# Patient Record
Sex: Female | Born: 1990 | Race: Black or African American | Hispanic: No | Marital: Single | Smoking: Never smoker
Health system: Southern US, Community
[De-identification: ages and names within clinical notes are randomized; demographics above are authoritative.]

## PROBLEM LIST (undated history)

## (undated) DIAGNOSIS — N946 Dysmenorrhea, unspecified: Secondary | ICD-10-CM

## (undated) DIAGNOSIS — B009 Herpesviral infection, unspecified: Secondary | ICD-10-CM

## (undated) DIAGNOSIS — D649 Anemia, unspecified: Secondary | ICD-10-CM

## (undated) DIAGNOSIS — R011 Cardiac murmur, unspecified: Secondary | ICD-10-CM

## (undated) DIAGNOSIS — O2230 Deep phlebothrombosis in pregnancy, unspecified trimester: Secondary | ICD-10-CM

## (undated) HISTORY — PX: NO PAST SURGERIES: SHX2092

## (undated) HISTORY — DX: Cardiac murmur, unspecified: R01.1

## (undated) HISTORY — DX: Dysmenorrhea, unspecified: N94.6

## (undated) HISTORY — DX: Anemia, unspecified: D64.9

## (undated) HISTORY — DX: Herpesviral infection, unspecified: B00.9

## (undated) HISTORY — PX: CYST REMOVAL HAND: SHX6279

## (undated) HISTORY — DX: Deep phlebothrombosis in pregnancy, unspecified trimester: O22.30

---

## 2013-12-31 ENCOUNTER — Ambulatory Visit (INDEPENDENT_AMBULATORY_CARE_PROVIDER_SITE_OTHER): Payer: PRIVATE HEALTH INSURANCE | Admitting: Certified Nurse Midwife

## 2013-12-31 ENCOUNTER — Encounter: Payer: Self-pay | Admitting: Certified Nurse Midwife

## 2013-12-31 VITALS — BP 100/60 | HR 64 | Resp 16 | Ht 64.0 in | Wt 103.0 lb

## 2013-12-31 DIAGNOSIS — Z862 Personal history of diseases of the blood and blood-forming organs and certain disorders involving the immune mechanism: Secondary | ICD-10-CM

## 2013-12-31 DIAGNOSIS — N946 Dysmenorrhea, unspecified: Secondary | ICD-10-CM

## 2013-12-31 DIAGNOSIS — Z01419 Encounter for gynecological examination (general) (routine) without abnormal findings: Secondary | ICD-10-CM

## 2013-12-31 DIAGNOSIS — D509 Iron deficiency anemia, unspecified: Secondary | ICD-10-CM

## 2013-12-31 DIAGNOSIS — Z309 Encounter for contraceptive management, unspecified: Secondary | ICD-10-CM

## 2013-12-31 DIAGNOSIS — Z0000 Encounter for general adult medical examination without abnormal findings: Secondary | ICD-10-CM

## 2013-12-31 LAB — POCT URINALYSIS DIPSTICK
Bilirubin, UA: NEGATIVE
Blood, UA: NEGATIVE
Glucose, UA: NEGATIVE
Ketones, UA: NEGATIVE
Leukocytes, UA: NEGATIVE
NITRITE UA: NEGATIVE
Protein, UA: NEGATIVE
UROBILINOGEN UA: NEGATIVE
pH, UA: 5

## 2013-12-31 LAB — CBC
HCT: 37.2 % (ref 36.0–46.0)
HEMOGLOBIN: 10.9 g/dL — AB (ref 12.0–15.0)
MCH: 20.5 pg — ABNORMAL LOW (ref 26.0–34.0)
MCHC: 29.3 g/dL — ABNORMAL LOW (ref 30.0–36.0)
MCV: 70.1 fL — ABNORMAL LOW (ref 78.0–100.0)
Platelets: 270 10*3/uL (ref 150–400)
RBC: 5.31 MIL/uL — AB (ref 3.87–5.11)
RDW: 14.9 % (ref 11.5–15.5)
WBC: 4.8 10*3/uL (ref 4.0–10.5)

## 2013-12-31 LAB — HEMOGLOBIN, FINGERSTICK: Hemoglobin, fingerstick: 11.1 g/dL — ABNORMAL LOW (ref 12.0–16.0)

## 2013-12-31 LAB — IBC PANEL
%SAT: 7 % — AB (ref 20–55)
TIBC: 474 ug/dL — ABNORMAL HIGH (ref 250–470)
UIBC: 442 ug/dL — ABNORMAL HIGH (ref 125–400)

## 2013-12-31 LAB — IRON: Iron: 32 ug/dL — ABNORMAL LOW (ref 42–145)

## 2013-12-31 MED ORDER — NORETHIN ACE-ETH ESTRAD-FE 1-20 MG-MCG(24) PO TABS: 1.0000 | ORAL_TABLET | Freq: Every day | ORAL | Status: DC

## 2013-12-31 NOTE — Patient Instructions (Signed)
General topics  Next pap or exam is  due in 1 year Take a Women's multivitamin Take 1200 mg. of calcium daily - prefer dietary If any concerns in interim to call back  Breast Self-Awareness Practicing breast self-awareness may pick up problems early, prevent significant medical complications, and possibly save your life. By practicing breast self-awareness, you can become familiar with how your breasts look and feel and if your breasts are changing. This allows you to notice changes early. It can also offer you some reassurance that your breast health is good. One way to learn what is normal for your breasts and whether your breasts are changing is to do a breast self-exam. If you find a lump or something that was not present in the past, it is best to contact your caregiver right away. Other findings that should be evaluated by your caregiver include nipple discharge, especially if it is bloody; skin changes or reddening; areas where the skin seems to be pulled in (retracted); or new lumps and bumps. Breast pain is seldom associated with cancer (malignancy), but should also be evaluated by a caregiver. BREAST SELF-EXAM The best time to examine your breasts is 5 7 days after your menstrual period is over.  ExitCare Patient Information 2013 [employer].   Exercise to Stay Healthy Exercise helps you become and stay healthy. EXERCISE IDEAS AND TIPS Choose exercises that:  You enjoy.  Fit into your day. You do not need to exercise really hard to be healthy. You can do exercises at a slow or medium level and stay healthy. You can:  Stretch before and after working out.  Try yoga, Pilates, or tai chi.  Lift weights.  Walk fast, swim, jog, run, climb stairs, bicycle, dance, or rollerskate.  Take aerobic classes. Exercises that burn about 150 calories:  Running 1  miles in 15 minutes.  Playing volleyball for 45 to 60 minutes.  Washing and waxing a car for 45 to 60  minutes.  Playing touch football for 45 minutes.  Walking 1  miles in 35 minutes.  Pushing a stroller 1  miles in 30 minutes.  Playing basketball for 30 minutes.  Raking leaves for 30 minutes.  Bicycling 5 miles in 30 minutes.  Walking 2 miles in 30 minutes.  Dancing for 30 minutes.  Shoveling snow for 15 minutes.  Swimming laps for 20 minutes.  Walking up stairs for 15 minutes.  Bicycling 4 miles in 15 minutes.  Gardening for 30 to 45 minutes.  Jumping rope for 15 minutes.  Washing windows or floors for 45 to 60 minutes. Document Released: 05/11/2010 Document Revised: 07/01/2011 Document Reviewed: 05/11/2010 ExitCare Patient Information 2013 [employer].   Other topics ( that may be useful information):    Sexually Transmitted Disease Sexually transmitted disease (STD) refers to any infection that is passed from person to person during sexual activity. This may happen by way of saliva, semen, blood, vaginal mucus, or urine. Common STDs include:  Gonorrhea.  Chlamydia.  Syphilis.  HIV/AIDS.  Genital herpes.  Hepatitis B and C.  Trichomonas.  Human papillomavirus (HPV).  Pubic lice. CAUSES  An STD may be spread by bacteria, virus, or parasite. A person can get an STD by:  Sexual intercourse with an infected person.  Sharing sex toys with an infected person.  Sharing needles with an infected person.  Having intimate contact with the genitals, mouth, or rectal areas of an infected person. SYMPTOMS  Some people may not have any symptoms, but   they can still pass the infection to others. Different STDs have different symptoms. Symptoms include:  Painful or bloody urination.  Pain in the pelvis, abdomen, vagina, anus, throat, or eyes.  Skin rash, itching, irritation, growths, or sores (lesions). These usually occur in the genital or anal area.  Abnormal vaginal discharge.  Penile discharge in men.  Soft, flesh-colored skin growths in the  genital or anal area.  Fever.  Pain or bleeding during sexual intercourse.  Swollen glands in the groin area.  Yellow skin and eyes (jaundice). This is seen with hepatitis. DIAGNOSIS  To make a diagnosis, your caregiver may:  Take a medical history.  Perform a physical exam.  Take a specimen (culture) to be examined.  Examine a sample of discharge under a microscope.  Perform blood test TREATMENT   Chlamydia, gonorrhea, trichomonas, and syphilis can be cured with antibiotic medicine.  Genital herpes, hepatitis, and HIV can be treated, but not cured, with prescribed medicines. The medicines will lessen the symptoms.  Genital warts from HPV can be treated with medicine or by freezing, burning (electrocautery), or surgery. Warts may come back.  HPV is a virus and cannot be cured with medicine or surgery.However, abnormal areas may be followed very closely by your caregiver and may be removed from the cervix, vagina, or vulva through office procedures or surgery. If your diagnosis is confirmed, your recent sexual partners need treatment. This is true even if they are symptom-free or have a negative culture or evaluation. They should not have sex until their caregiver says it is okay. HOME CARE INSTRUCTIONS  All sexual partners should be informed, tested, and treated for all STDs.  Take your antibiotics as directed. Finish them even if you start to feel better.  Only take over-the-counter or prescription medicines for pain, discomfort, or fever as directed by your caregiver.  Rest.  Eat a balanced diet and drink enough fluids to keep your urine clear or pale yellow.  Do not have sex until treatment is completed and you have followed up with your caregiver. STDs should be checked after treatment.  Keep all follow-up appointments, Pap tests, and blood tests as directed by your caregiver.  Only use latex condoms and water-soluble lubricants during sexual activity. Do not use  petroleum jelly or oils.  Avoid alcohol and illegal drugs.  Get vaccinated for HPV and hepatitis. If you have not received these vaccines in the past, talk to your caregiver about whether one or both might be right for you.  Avoid risky sex practices that can break the skin. The only way to avoid getting an STD is to avoid all sexual activity.Latex condoms and dental dams (for oral sex) will help lessen the risk of getting an STD, but will not completely eliminate the risk. SEEK MEDICAL CARE IF:   You have a fever.  You have any new or worsening symptoms. Document Released: 06/29/2002 Document Revised: 07/01/2011 Document Reviewed: 07/06/2010 [facility] Patient Information 2013 [employer].    Domestic Abuse You are being battered or abused if someone close to you hits, pushes, or physically hurts you in any way. You also are being abused if you are forced into activities. You are being sexually abused if you are forced to have sexual contact of any kind. You are being emotionally abused if you are made to feel worthless or if you are constantly threatened. It is important to remember that help is available. No one has the right to abuse you. PREVENTION OF FURTHER  ABUSE  Learn the warning signs of danger. This varies with situations but may include: the use of alcohol, threats, isolation from friends and family, or forced sexual contact. Leave if you feel that violence is going to occur.  If you are attacked or beaten, report it to the police so the abuse is documented. You do not have to press charges. The police can protect you while you or the attackers are leaving. Get the officer's name and badge number and a copy of the report.  Find someone you can trust and tell them what is happening to you: your caregiver, a nurse, clergy member, close friend or family member. Feeling ashamed is natural, but remember that you have done nothing wrong. No one deserves abuse. Document Released:  04/05/2000 Document Revised: 07/01/2011 Document Reviewed: 06/14/2010 ExitCare Patient Information 2013 [employer].    How Much is Too Much Alcohol? Drinking too much alcohol can cause injury, accidents, and health problems. These types of problems can include:   Car crashes.  Falls.  Family fighting (domestic violence).  Drowning.  Fights.  Injuries.  Burns.  Damage to certain organs.  Having a baby with birth defects. ONE DRINK CAN BE TOO MUCH WHEN YOU ARE:  Working.  Pregnant or breastfeeding.  Taking medicines. Ask your doctor.  Driving or planning to drive. If you or someone you know has a drinking problem, get help from a doctor.  Document Released: 02/02/2009 Document Revised: 07/01/2011 Document Reviewed: 02/02/2009 ExitCare Patient Information 2013 [employer].   Smoking Hazards Smoking cigarettes is extremely bad for your health. Tobacco smoke has over 200 known poisons in it. There are over 60 chemicals in tobacco smoke that cause cancer. Some of the chemicals found in cigarette smoke include:   Cyanide.  Benzene.  Formaldehyde.  Methanol (wood alcohol).  Acetylene (fuel used in welding torches).  Ammonia. Cigarette smoke also contains the poisonous gases nitrogen oxide and carbon monoxide.  Cigarette smokers have an increased risk of many serious medical problems and Smoking causes approximately:  90% of all lung cancer deaths in men.  80% of all lung cancer deaths in women.  90% of deaths from chronic obstructive lung disease. Compared with nonsmokers, smoking increases the risk of:  Coronary heart disease by 2 to 4 times.  Stroke by 2 to 4 times.  Men developing lung cancer by 23 times.  Women developing lung cancer by 13 times.  Dying from chronic obstructive lung diseases by 12 times.  . Smoking is the most preventable cause of death and disease in our society.  WHY IS SMOKING ADDICTIVE?  Nicotine is the chemical  agent in tobacco that is capable of causing addiction or dependence.  When you smoke and inhale, nicotine is absorbed rapidly into the bloodstream through your lungs. Nicotine absorbed through the lungs is capable of creating a powerful addiction. Both inhaled and non-inhaled nicotine may be addictive.  Addiction studies of cigarettes and spit tobacco show that addiction to nicotine occurs mainly during the teen years, when young people begin using tobacco products. WHAT ARE THE BENEFITS OF QUITTING?  There are many health benefits to quitting smoking.   Likelihood of developing cancer and heart disease decreases. Health improvements are seen almost immediately.  Blood pressure, pulse rate, and breathing patterns start returning to normal soon after quitting. QUITTING SMOKING   American Lung Association - 1-800-LUNGUSA  American Cancer Society - 1-800-ACS-2345 Document Released: 05/16/2004 Document Revised: 07/01/2011 Document Reviewed: 01/18/2009 ExitCare Patient Information 2013 ExitCare,   LLC.   Stress Management Stress is a state of physical or mental tension that often results from changes in your life or normal routine. Some common causes of stress are:  Death of a loved one.  Injuries or severe illnesses.  Getting fired or changing jobs.  Moving into a new home. Other causes may be:  Sexual problems.  Business or financial losses.  Taking on a large debt.  Regular conflict with someone at home or at work.  Constant tiredness from lack of sleep. It is not just bad things that are stressful. It may be stressful to:  Win the lottery.  Get married.  Buy a new car. The amount of stress that can be easily tolerated varies from person to person. Changes generally cause stress, regardless of the types of change. Too much stress can affect your health. It may lead to physical or emotional problems. Too little stress (boredom) may also become stressful. SUGGESTIONS TO  REDUCE STRESS:  Talk things over with your family and friends. It often is helpful to share your concerns and worries. If you feel your problem is serious, you may want to get help from a professional counselor.  Consider your problems one at a time instead of lumping them all together. Trying to take care of everything at once may seem impossible. List all the things you need to do and then start with the most important one. Set a goal to accomplish 2 or 3 things each day. If you expect to do too many in a single day you will naturally fail, causing you to feel even more stressed.  Do not use alcohol or drugs to relieve stress. Although you may feel better for a short time, they do not remove the problems that caused the stress. They can also be habit forming.  Exercise regularly - at least 3 times per week. Physical exercise can help to relieve that "uptight" feeling and will relax you.  The shortest distance between despair and hope is often a good night's sleep.  Go to bed and get up on time allowing yourself time for appointments without being rushed.  Take a short "time-out" period from any stressful situation that occurs during the day. Close your eyes and take some deep breaths. Starting with the muscles in your face, tense them, hold it for a few seconds, then relax. Repeat this with the muscles in your neck, shoulders, hand, stomach, back and legs.  Take good care of yourself. Eat a balanced diet and get plenty of rest.  Schedule time for having fun. Take a break from your daily routine to relax. HOME CARE INSTRUCTIONS   Call if you feel overwhelmed by your problems and feel you can no longer manage them on your own.  Return immediately if you feel like hurting yourself or someone else. Document Released: 10/02/2000 Document Revised: 07/01/2011 Document Reviewed: 05/25/2007 ExitCare Patient Information 2013 ExitCare[employer].   

## 2013-12-31 NOTE — Progress Notes (Signed)
22 y.o. G0P0000 Single African American Fe here to establish gyn care and  for annual exam. Previous history of dysmenorrhea with OCP use, requested again. No partner change, no STD screening needed, last recent screen negative.  No health issues today. Sees Urgent care if needed. Student at [employer]  Patient's last menstrual period was 12/13/2013.          Sexually active: Yes.    The current method of family planning is condoms all the time.    Exercising: Yes.    walking Smoker:  no  Health Maintenance: Pap:  2014 neg, no abnormal in past MMG:  none Colonoscopy:  none BMD:   none TDaP: 2013 Labs: Poct urine-neg, Hgb-11.1 Self breast exam: done occ   reports that she has never smoked. She does not have any smokeless tobacco history on file. She reports that she does not drink alcohol or use illicit drugs.  Past Medical History  Diagnosis Date  . Anemia   . Dysmenorrhea   . Heart murmur     Past Surgical History  Procedure Laterality Date  . Cyst removal hand      No current outpatient prescriptions on file.   No current facility-administered medications for this visit.    Family History  Problem Relation Age of Onset  . Hypertension Father   . Hypertension Maternal Grandmother   . Heart attack Maternal Grandmother     ROS:  Pertinent items are noted in HPI.  Otherwise, a comprehensive ROS was negative.  Exam:   BP 100/60  Pulse 64  Resp 16  Ht  (1.626 m)  Wt 103 lb (46.72 kg)  BMI 17.67 kg/m2  LMP 12/13/2013 Height:  (162.6 cm)  Ht Readings from Last 3 Encounters:  12/31/13  (1.626 m)    General appearance: alert, cooperative and appears stated age Head: Normocephalic, without obvious abnormality, atraumatic Neck: no adenopathy, supple, symmetrical, trachea midline and thyroid normal to inspection and palpation Lungs: clear to auscultation bilaterally Breasts: normal appearance, no masses or tenderness, No nipple retraction or dimpling, No  nipple discharge or bleeding, No axillary or supraclavicular adenopathy Heart: regular rate and rhythm Abdomen: soft, non-tender; no masses,  no organomegaly Extremities: extremities normal, atraumatic, no cyanosis or edema Skin: Skin color, texture, turgor normal. No rashes or lesions Lymph nodes: Cervical, supraclavicular, and axillary nodes normal. No abnormal inguinal nodes palpated Neurologic: Grossly normal   Pelvic: External genitalia:  no lesions              Urethra:  normal appearing urethra with no masses, tenderness or lesions              Bartholin's and Skene's: normal                 Vagina: normal appearing vagina with normal color and discharge, no lesions              Cervix: normal, non tender, no lesions              Pap taken: No. Bimanual Exam:  Uterus:  normal size, contour, position, consistency, mobility, non-tender and anteverted              Adnexa: normal adnexa and no mass, fullness, tenderness               Rectovaginal: Confirms               Anus:  normal appearance  A:  Well Woman with  normal exam  Contraception/cycle control for dysmenorrhea desired  Underweight for height, but eats well and normal for her  History of anemia, borderline low today  P:   Reviewed health and wellness pertinent to exam  Rx Loestrin 24 FE see order  Labs: Iron, CBC, Ferritin, IBC  Pap smear not taken today   counseled on breast self exam, STD prevention, HIV risk factors and prevention, use and side effects of OCP's, adequate intake of calcium and vitamin D, diet and exercise  return annually or prn  An After Visit Summary was printed and given to the patient.

## 2014-01-01 LAB — FERRITIN: Ferritin: 8 ng/mL — ABNORMAL LOW (ref 10–291)

## 2014-01-03 MED ORDER — FUSION PLUS PO CAPS: 1.0000 | ORAL_CAPSULE | Freq: Every day | ORAL | Status: DC

## 2014-01-10 NOTE — Progress Notes (Signed)
Reviewed personally.  M. Suzanne Deklen Popelka, MD.  

## 2014-02-02 ENCOUNTER — Other Ambulatory Visit (INDEPENDENT_AMBULATORY_CARE_PROVIDER_SITE_OTHER): Payer: PRIVATE HEALTH INSURANCE

## 2014-02-02 DIAGNOSIS — D509 Iron deficiency anemia, unspecified: Secondary | ICD-10-CM

## 2014-02-03 LAB — CBC
HEMATOCRIT: 36.2 % (ref 36.0–46.0)
Hemoglobin: 10.7 g/dL — ABNORMAL LOW (ref 12.0–15.0)
MCH: 21 pg — ABNORMAL LOW (ref 26.0–34.0)
MCHC: 29.6 g/dL — ABNORMAL LOW (ref 30.0–36.0)
MCV: 71.1 fL — AB (ref 78.0–100.0)
Platelets: 298 10*3/uL (ref 150–400)
RBC: 5.09 MIL/uL (ref 3.87–5.11)
RDW: 15.3 % (ref 11.5–15.5)
WBC: 4.6 10*3/uL (ref 4.0–10.5)

## 2014-02-03 LAB — IBC PANEL
%SAT: 20 % (ref 20–55)
TIBC: 491 ug/dL — AB (ref 250–470)
UIBC: 394 ug/dL (ref 125–400)

## 2014-02-03 LAB — IRON: IRON: 97 ug/dL (ref 42–145)

## 2014-02-03 LAB — FERRITIN: FERRITIN: 10 ng/mL (ref 10–291)

## 2015-06-20 ENCOUNTER — Ambulatory Visit (INDEPENDENT_AMBULATORY_CARE_PROVIDER_SITE_OTHER): Payer: PRIVATE HEALTH INSURANCE | Admitting: Certified Nurse Midwife

## 2015-06-20 ENCOUNTER — Encounter: Payer: Self-pay | Admitting: Certified Nurse Midwife

## 2015-06-20 VITALS — BP 100/64 | HR 70 | Resp 16 | Ht 63.75 in | Wt 107.0 lb

## 2015-06-20 DIAGNOSIS — Z01419 Encounter for gynecological examination (general) (routine) without abnormal findings: Secondary | ICD-10-CM | POA: Diagnosis not present

## 2015-06-20 DIAGNOSIS — D649 Anemia, unspecified: Secondary | ICD-10-CM | POA: Diagnosis not present

## 2015-06-20 DIAGNOSIS — Z124 Encounter for screening for malignant neoplasm of cervix: Secondary | ICD-10-CM

## 2015-06-20 DIAGNOSIS — Z0000 Encounter for general adult medical examination without abnormal findings: Secondary | ICD-10-CM

## 2015-06-20 LAB — POCT URINALYSIS DIPSTICK
BILIRUBIN UA: NEGATIVE
Blood, UA: NEGATIVE
GLUCOSE UA: NEGATIVE
KETONES UA: NEGATIVE
Leukocytes, UA: NEGATIVE
Nitrite, UA: NEGATIVE
PH UA: 5
Protein, UA: NEGATIVE
Urobilinogen, UA: NEGATIVE

## 2015-06-20 LAB — CBC
HCT: 37.8 % (ref 36.0–46.0)
HEMOGLOBIN: 11 g/dL — AB (ref 12.0–15.0)
MCH: 20.4 pg — AB (ref 26.0–34.0)
MCHC: 29.1 g/dL — ABNORMAL LOW (ref 30.0–36.0)
MCV: 70 fL — ABNORMAL LOW (ref 78.0–100.0)
MPV: 9.9 fL (ref 8.6–12.4)
Platelets: 330 10*3/uL (ref 150–400)
RBC: 5.4 MIL/uL — AB (ref 3.87–5.11)
RDW: 14.3 % (ref 11.5–15.5)
WBC: 3.8 10*3/uL — ABNORMAL LOW (ref 4.0–10.5)

## 2015-06-20 LAB — IRON: IRON: 79 ug/dL (ref 40–190)

## 2015-06-20 NOTE — Progress Notes (Signed)
25 y.o. G0P0000 Single  African American Fe here for annual exam Periods normal, with cramping with good relief from Advil use. STD screening desired. No partner change. Some vaginal odor change but no itching/burning or increase in discharge. No new personal products. Sees Urgent care if needed. No other health issues today.    Patient's last menstrual period was 06/09/2015.          Sexually active: Yes.    The current method of family planning is condoms sometimes.    Exercising: Yes.    walking, fitness class Smoker:  no  Health Maintenance: Pap:  2014 neg MMG:  none Colonoscopy:  none BMD:   none TDaP:  2013 Shingles: no Pneumonia: no Hep C and HIV: not done Labs: poct urine-neg Self breast exam: done monthly   reports that she has never smoked. She has never used smokeless tobacco. She reports that she drinks about 1.8 oz of alcohol per week. She reports that she does not use illicit drugs.  Past Medical History  Diagnosis Date  . Anemia   . Dysmenorrhea   . Heart murmur     Past Surgical History  Procedure Laterality Date  . Cyst removal hand      No current outpatient prescriptions on file.   No current facility-administered medications for this visit.    Family History  Problem Relation Age of Onset  . Hypertension Father   . Hypertension Maternal Grandmother   . Heart attack Maternal Grandmother     ROS:  Pertinent items are noted in HPI.  Otherwise, a comprehensive ROS was negative.  Exam:   BP 100/64 mmHg  Pulse 70  Resp 16  Ht 5' 3.75" (1.619 m)  Wt 107 lb (48.535 kg)  BMI 18.52 kg/m2  LMP 06/09/2015 Height: 5' 3.75" (161.9 cm) Ht Readings from Last 3 Encounters:  06/20/15 5' 3.75" (1.619 m)  12/31/13  (1.626 m)    General appearance: alert, cooperative and appears stated age Head: Normocephalic, without obvious abnormality, atraumatic Neck: no adenopathy, supple, symmetrical, trachea midline and thyroid normal to inspection and  palpation Lungs: clear to auscultation bilaterally Breasts: normal appearance, no masses or tenderness, No nipple retraction or dimpling, No nipple discharge or bleeding, No axillary or supraclavicular adenopathy Heart: regular rate and rhythm Abdomen: soft, non-tender; no masses,  no organomegaly Extremities: extremities normal, atraumatic, no cyanosis or edema Skin: Skin color, texture, turgor normal. No rashes or lesions Lymph nodes: Cervical, supraclavicular, and axillary nodes normal. No abnormal inguinal nodes palpated Neurologic: Grossly normal   Pelvic: External genitalia:  no lesions              Urethra:  normal appearing urethra with no masses, tenderness or lesions              Bartholin's and Skene's: normal                 Vagina: normal appearing vagina with normal color and discharge, no lesions              Cervix: normal,non tender,no lesions              Pap taken: Yes.   Bimanual Exam:  Uterus:  normal size, contour, position, consistency, mobility, non-tender              Adnexa: normal adnexa and no mass, fullness, tenderness               Rectovaginal: Confirms  Anus:  normal appearance  Chaperone present: yes  A:  Well Woman with normal exam  Contraception condoms  STD screening, R/O vaginal infection  History of anemia, not on iron supplement in a while  P:   Reviewed health and wellness pertinent to exam  Discussed consist use for protection  Discussed importance of iron in diet and risk of infection with anemia  Pap smear as above with HPV reflex   counseled on breast self exam, STD prevention, HIV risk factors and prevention, adequate intake of calcium and vitamin D, diet and exercise return annually or prn  An After Visit Summary was printed and given to the patient.

## 2015-06-20 NOTE — Progress Notes (Signed)
Reviewed personally.  M. Suzanne Danyka Merlin, MD.  

## 2015-06-20 NOTE — Patient Instructions (Signed)
General topics  Next pap or exam is  due in 1 year Take a Women's multivitamin Take 1200 mg. of calcium daily - prefer dietary If any concerns in interim to call back  Breast Self-Awareness Practicing breast self-awareness may pick up problems early, prevent significant medical complications, and possibly save your life. By practicing breast self-awareness, you can become familiar with how your breasts look and feel and if your breasts are changing. This allows you to notice changes early. It can also offer you some reassurance that your breast health is good. One way to learn what is normal for your breasts and whether your breasts are changing is to do a breast self-exam. If you find a lump or something that was not present in the past, it is best to contact your caregiver right away. Other findings that should be evaluated by your caregiver include nipple discharge, especially if it is bloody; skin changes or reddening; areas where the skin seems to be pulled in (retracted); or new lumps and bumps. Breast pain is seldom associated with cancer (malignancy), but should also be evaluated by a caregiver. BREAST SELF-EXAM The best time to examine your breasts is 5 7 days after your menstrual period is over.  ExitCare Patient Information 2013 [employer].   Exercise to Stay Healthy Exercise helps you become and stay healthy. EXERCISE IDEAS AND TIPS Choose exercises that:  You enjoy.  Fit into your day. You do not need to exercise really hard to be healthy. You can do exercises at a slow or medium level and stay healthy. You can:  Stretch before and after working out.  Try yoga, Pilates, or tai chi.  Lift weights.  Walk fast, swim, jog, run, climb stairs, bicycle, dance, or rollerskate.  Take aerobic classes. Exercises that burn about 150 calories:  Running 1  miles in 15 minutes.  Playing volleyball for 45 to 60 minutes.  Washing and waxing a car for 45 to 60  minutes.  Playing touch football for 45 minutes.  Walking 1  miles in 35 minutes.  Pushing a stroller 1  miles in 30 minutes.  Playing basketball for 30 minutes.  Raking leaves for 30 minutes.  Bicycling 5 miles in 30 minutes.  Walking 2 miles in 30 minutes.  Dancing for 30 minutes.  Shoveling snow for 15 minutes.  Swimming laps for 20 minutes.  Walking up stairs for 15 minutes.  Bicycling 4 miles in 15 minutes.  Gardening for 30 to 45 minutes.  Jumping rope for 15 minutes.  Washing windows or floors for 45 to 60 minutes. Document Released: 05/11/2010 Document Revised: 07/01/2011 Document Reviewed: 05/11/2010 ExitCare Patient Information 2013 [employer].   Other topics ( that may be useful information):    Sexually Transmitted Disease Sexually transmitted disease (STD) refers to any infection that is passed from person to person during sexual activity. This may happen by way of saliva, semen, blood, vaginal mucus, or urine. Common STDs include:  Gonorrhea.  Chlamydia.  Syphilis.  HIV/AIDS.  Genital herpes.  Hepatitis B and C.  Trichomonas.  Human papillomavirus (HPV).  Pubic lice. CAUSES  An STD may be spread by bacteria, virus, or parasite. A person can get an STD by:  Sexual intercourse with an infected person.  Sharing sex toys with an infected person.  Sharing needles with an infected person.  Having intimate contact with the genitals, mouth, or rectal areas of an infected person. SYMPTOMS  Some people may not have any symptoms, but   they can still pass the infection to others. Different STDs have different symptoms. Symptoms include:  Painful or bloody urination.  Pain in the pelvis, abdomen, vagina, anus, throat, or eyes.  Skin rash, itching, irritation, growths, or sores (lesions). These usually occur in the genital or anal area.  Abnormal vaginal discharge.  Penile discharge in men.  Soft, flesh-colored skin growths in the  genital or anal area.  Fever.  Pain or bleeding during sexual intercourse.  Swollen glands in the groin area.  Yellow skin and eyes (jaundice). This is seen with hepatitis. DIAGNOSIS  To make a diagnosis, your caregiver may:  Take a medical history.  Perform a physical exam.  Take a specimen (culture) to be examined.  Examine a sample of discharge under a microscope.  Perform blood test TREATMENT   Chlamydia, gonorrhea, trichomonas, and syphilis can be cured with antibiotic medicine.  Genital herpes, hepatitis, and HIV can be treated, but not cured, with prescribed medicines. The medicines will lessen the symptoms.  Genital warts from HPV can be treated with medicine or by freezing, burning (electrocautery), or surgery. Warts may come back.  HPV is a virus and cannot be cured with medicine or surgery.However, abnormal areas may be followed very closely by your caregiver and may be removed from the cervix, vagina, or vulva through office procedures or surgery. If your diagnosis is confirmed, your recent sexual partners need treatment. This is true even if they are symptom-free or have a negative culture or evaluation. They should not have sex until their caregiver says it is okay. HOME CARE INSTRUCTIONS  All sexual partners should be informed, tested, and treated for all STDs.  Take your antibiotics as directed. Finish them even if you start to feel better.  Only take over-the-counter or prescription medicines for pain, discomfort, or fever as directed by your caregiver.  Rest.  Eat a balanced diet and drink enough fluids to keep your urine clear or pale yellow.  Do not have sex until treatment is completed and you have followed up with your caregiver. STDs should be checked after treatment.  Keep all follow-up appointments, Pap tests, and blood tests as directed by your caregiver.  Only use latex condoms and water-soluble lubricants during sexual activity. Do not use  petroleum jelly or oils.  Avoid alcohol and illegal drugs.  Get vaccinated for HPV and hepatitis. If you have not received these vaccines in the past, talk to your caregiver about whether one or both might be right for you.  Avoid risky sex practices that can break the skin. The only way to avoid getting an STD is to avoid all sexual activity.Latex condoms and dental dams (for oral sex) will help lessen the risk of getting an STD, but will not completely eliminate the risk. SEEK MEDICAL CARE IF:   You have a fever.  You have any new or worsening symptoms. Document Released: 06/29/2002 Document Revised: 07/01/2011 Document Reviewed: 07/06/2010 [facility] Patient Information 2013 [employer].    Domestic Abuse You are being battered or abused if someone close to you hits, pushes, or physically hurts you in any way. You also are being abused if you are forced into activities. You are being sexually abused if you are forced to have sexual contact of any kind. You are being emotionally abused if you are made to feel worthless or if you are constantly threatened. It is important to remember that help is available. No one has the right to abuse you. PREVENTION OF FURTHER  ABUSE  Learn the warning signs of danger. This varies with situations but may include: the use of alcohol, threats, isolation from friends and family, or forced sexual contact. Leave if you feel that violence is going to occur.  If you are attacked or beaten, report it to the police so the abuse is documented. You do not have to press charges. The police can protect you while you or the attackers are leaving. Get the officer's name and badge number and a copy of the report.  Find someone you can trust and tell them what is happening to you: your caregiver, a nurse, clergy member, close friend or family member. Feeling ashamed is natural, but remember that you have done nothing wrong. No one deserves abuse. Document Released:  04/05/2000 Document Revised: 07/01/2011 Document Reviewed: 06/14/2010 ExitCare Patient Information 2013 [employer].    How Much is Too Much Alcohol? Drinking too much alcohol can cause injury, accidents, and health problems. These types of problems can include:   Car crashes.  Falls.  Family fighting (domestic violence).  Drowning.  Fights.  Injuries.  Burns.  Damage to certain organs.  Having a baby with birth defects. ONE DRINK CAN BE TOO MUCH WHEN YOU ARE:  Working.  Pregnant or breastfeeding.  Taking medicines. Ask your doctor.  Driving or planning to drive. If you or someone you know has a drinking problem, get help from a doctor.  Document Released: 02/02/2009 Document Revised: 07/01/2011 Document Reviewed: 02/02/2009 ExitCare Patient Information 2013 [employer].   Smoking Hazards Smoking cigarettes is extremely bad for your health. Tobacco smoke has over 200 known poisons in it. There are over 60 chemicals in tobacco smoke that cause cancer. Some of the chemicals found in cigarette smoke include:   Cyanide.  Benzene.  Formaldehyde.  Methanol (wood alcohol).  Acetylene (fuel used in welding torches).  Ammonia. Cigarette smoke also contains the poisonous gases nitrogen oxide and carbon monoxide.  Cigarette smokers have an increased risk of many serious medical problems and Smoking causes approximately:  90% of all lung cancer deaths in men.  80% of all lung cancer deaths in women.  90% of deaths from chronic obstructive lung disease. Compared with nonsmokers, smoking increases the risk of:  Coronary heart disease by 2 to 4 times.  Stroke by 2 to 4 times.  Men developing lung cancer by 23 times.  Women developing lung cancer by 13 times.  Dying from chronic obstructive lung diseases by 12 times.  . Smoking is the most preventable cause of death and disease in our society.  WHY IS SMOKING ADDICTIVE?  Nicotine is the chemical  agent in tobacco that is capable of causing addiction or dependence.  When you smoke and inhale, nicotine is absorbed rapidly into the bloodstream through your lungs. Nicotine absorbed through the lungs is capable of creating a powerful addiction. Both inhaled and non-inhaled nicotine may be addictive.  Addiction studies of cigarettes and spit tobacco show that addiction to nicotine occurs mainly during the teen years, when young people begin using tobacco products. WHAT ARE THE BENEFITS OF QUITTING?  There are many health benefits to quitting smoking.   Likelihood of developing cancer and heart disease decreases. Health improvements are seen almost immediately.  Blood pressure, pulse rate, and breathing patterns start returning to normal soon after quitting. QUITTING SMOKING   American Lung Association - 1-800-LUNGUSA  American Cancer Society - 1-800-ACS-2345 Document Released: 05/16/2004 Document Revised: 07/01/2011 Document Reviewed: 01/18/2009 ExitCare Patient Information 2013 ExitCare,   LLC.   Stress Management Stress is a state of physical or mental tension that often results from changes in your life or normal routine. Some common causes of stress are:  Death of a loved one.  Injuries or severe illnesses.  Getting fired or changing jobs.  Moving into a new home. Other causes may be:  Sexual problems.  Business or financial losses.  Taking on a large debt.  Regular conflict with someone at home or at work.  Constant tiredness from lack of sleep. It is not just bad things that are stressful. It may be stressful to:  Win the lottery.  Get married.  Buy a new car. The amount of stress that can be easily tolerated varies from person to person. Changes generally cause stress, regardless of the types of change. Too much stress can affect your health. It may lead to physical or emotional problems. Too little stress (boredom) may also become stressful. SUGGESTIONS TO  REDUCE STRESS:  Talk things over with your family and friends. It often is helpful to share your concerns and worries. If you feel your problem is serious, you may want to get help from a professional counselor.  Consider your problems one at a time instead of lumping them all together. Trying to take care of everything at once may seem impossible. List all the things you need to do and then start with the most important one. Set a goal to accomplish 2 or 3 things each day. If you expect to do too many in a single day you will naturally fail, causing you to feel even more stressed.  Do not use alcohol or drugs to relieve stress. Although you may feel better for a short time, they do not remove the problems that caused the stress. They can also be habit forming.  Exercise regularly - at least 3 times per week. Physical exercise can help to relieve that "uptight" feeling and will relax you.  The shortest distance between despair and hope is often a good night's sleep.  Go to bed and get up on time allowing yourself time for appointments without being rushed.  Take a short "time-out" period from any stressful situation that occurs during the day. Close your eyes and take some deep breaths. Starting with the muscles in your face, tense them, hold it for a few seconds, then relax. Repeat this with the muscles in your neck, shoulders, hand, stomach, back and legs.  Take good care of yourself. Eat a balanced diet and get plenty of rest.  Schedule time for having fun. Take a break from your daily routine to relax. HOME CARE INSTRUCTIONS   Call if you feel overwhelmed by your problems and feel you can no longer manage them on your own.  Return immediately if you feel like hurting yourself or someone else. Document Released: 10/02/2000 Document Revised: 07/01/2011 Document Reviewed: 05/25/2007 ExitCare Patient Information 2013 ExitCare[employer].   

## 2015-06-21 ENCOUNTER — Other Ambulatory Visit: Payer: Self-pay | Admitting: Certified Nurse Midwife

## 2015-06-21 DIAGNOSIS — R899 Unspecified abnormal finding in specimens from other organs, systems and tissues: Secondary | ICD-10-CM

## 2015-06-21 LAB — WET PREP BY MOLECULAR PROBE
Candida species: NEGATIVE
Gardnerella vaginalis: POSITIVE — AB
Trichomonas vaginosis: NEGATIVE

## 2015-06-21 LAB — RPR

## 2015-06-21 LAB — HSV(HERPES SIMPLEX VRS) I + II AB-IGG: HSV 1 Glycoprotein G Ab, IgG: 6.45 IV — ABNORMAL HIGH

## 2015-06-21 LAB — HIV ANTIBODY (ROUTINE TESTING W REFLEX): HIV 1&2 Ab, 4th Generation: NONREACTIVE

## 2015-06-21 LAB — HEPATITIS C ANTIBODY: HCV Ab: NEGATIVE

## 2015-06-21 LAB — IPS PAP TEST WITH REFLEX TO HPV

## 2015-06-22 ENCOUNTER — Other Ambulatory Visit: Payer: Self-pay

## 2015-06-22 LAB — IPS N GONORRHOEA AND CHLAMYDIA BY PCR

## 2015-06-22 MED ORDER — METRONIDAZOLE 0.75 % VA GEL: VAGINAL | Status: DC

## 2015-08-21 ENCOUNTER — Telehealth: Payer: Self-pay | Admitting: Certified Nurse Midwife

## 2015-08-21 NOTE — Telephone Encounter (Signed)
Spoke with patient. Patient states that she has been on OCP in the past and would like to restart at this time. Reports she was on Loestrin before and did well. She would like to restart Loestrin again at this time. Last aex with Leota Sauerseborah Leonard CNM was on 06/20/2015. Advised I will speak with Leota Sauerseborah Leonard CNM and return call with further recommendations. She is agreeable. Pharmacy on file is correct.

## 2015-08-21 NOTE — Telephone Encounter (Signed)
Patient is ready to start birth control. Patient last seen 06/20/2015. Confirmed pharmacy with patient.

## 2015-08-22 ENCOUNTER — Other Ambulatory Visit: Payer: Self-pay | Admitting: Certified Nurse Midwife

## 2015-08-22 DIAGNOSIS — Z30011 Encounter for initial prescription of contraceptive pills: Secondary | ICD-10-CM

## 2015-08-22 MED ORDER — NORETHIN ACE-ETH ESTRAD-FE 1-20 MG-MCG(24) PO TABS: 1.0000 | ORAL_TABLET | Freq: Every day | ORAL | Status: DC

## 2015-08-22 NOTE — Telephone Encounter (Signed)
Left message to call Kaitlyn at [phone]. 

## 2015-08-22 NOTE — Telephone Encounter (Signed)
Ok to re start OCP Loestrin 24 Fe order placed. She needs to start on first day of period and BUM during first month. Please review warning signs with patient and need to advise. Order placed.

## 2015-08-22 NOTE — Telephone Encounter (Signed)
Spoke with patient. Advised of message as seen below from Deborah Leonard CNM. She is agreeable and verbalizes understanding.  Routing to provider for final review. Patient agreeable to disposition. Will close encounter.  

## 2015-12-28 ENCOUNTER — Encounter (HOSPITAL_COMMUNITY): Payer: Self-pay | Admitting: Emergency Medicine

## 2015-12-28 DIAGNOSIS — J0100 Acute maxillary sinusitis, unspecified: Secondary | ICD-10-CM | POA: Diagnosis not present

## 2015-12-28 DIAGNOSIS — R0981 Nasal congestion: Secondary | ICD-10-CM | POA: Diagnosis present

## 2015-12-28 DIAGNOSIS — J029 Acute pharyngitis, unspecified: Secondary | ICD-10-CM | POA: Diagnosis not present

## 2015-12-28 DIAGNOSIS — Z79899 Other long term (current) drug therapy: Secondary | ICD-10-CM | POA: Insufficient documentation

## 2015-12-28 NOTE — ED Triage Notes (Signed)
Pt states she thinks she has a sinus infection  Pt states she has pressure in her face and her throat is sore  Pt states she has been coughing and sneezing  Onset of sxs was yesterday

## 2015-12-29 ENCOUNTER — Emergency Department (HOSPITAL_COMMUNITY)
Admission: EM | Admit: 2015-12-29 | Discharge: 2015-12-29 | Disposition: A | Discharge status: Home / Self Care | Payer: PRIVATE HEALTH INSURANCE | Attending: Emergency Medicine | Admitting: Emergency Medicine

## 2015-12-29 DIAGNOSIS — J0100 Acute maxillary sinusitis, unspecified: Secondary | ICD-10-CM

## 2015-12-29 MED ORDER — AMOXICILLIN-POT CLAVULANATE 875-125 MG PO TABS: 1.0000 | ORAL_TABLET | Freq: Two times a day (BID) | ORAL | 0 refills | Status: DC

## 2015-12-29 NOTE — ED Provider Notes (Signed)
[facility]-EMERGENCY DEPT Provider Note   CSN: [id number] Arrival date & time: 12/28/15  2235 By signing my name below, I, Levon HedgerElizabeth Hall, attest that this documentation has been prepared under the direction and in the presence of Melene Planan Trinka Keshishyan, DO . Electronically Signed: Levon HedgerElizabeth Hall, Scribe. 12/29/2015. 1:21 AM.   History   Chief Complaint Chief Complaint  Patient presents with  . Nasal Congestion    HPI Gabrielle Huff is a 25 y.o. female who presents to the Emergency Department complaining of severe sinus pressure which began two days ago. Pt states pain is most localized in the nose. She notes associated cough, congestion, sneezing, and sore throat. Pt has taken zyrtec today with no relief. Pt denies any sick contact. She denies fever.   The history is provided by the patient. No language interpreter was used.    Past Medical History:  Diagnosis Date  . Anemia   . Dysmenorrhea   . Heart murmur     There are no active problems to display for this patient.   Past Surgical History:  Procedure Laterality Date  . CYST REMOVAL HAND      OB History    Gravida Para Term Preterm AB Living   0 0 0 0 0 0   SAB TAB Ectopic Multiple Live Births   0 0 0 0         Home Medications    Prior to Admission medications   Medication Sig Start Date End Date Taking? Authorizing Provider  amoxicillin-clavulanate (AUGMENTIN) 875-125 MG tablet Take 1 tablet by mouth 2 (two) times daily. One po bid x 7 days 12/29/15   Melene Planan Lynzie Cliburn, DO  metroNIDAZOLE (METROGEL) 0.75 % vaginal gel Apply applicatorful vaginally bid for 5 days 06/22/15   Verner Choleborah S Leonard, CNM  Norethindrone Acetate-Ethinyl Estrad-FE (LOESTRIN 24 FE) 1-20 MG-MCG(24) tablet Take 1 tablet by mouth daily. 08/22/15   Verner Choleborah S Leonard, CNM    Family History Family History  Problem Relation Age of Onset  . Hypertension Father   . Hypertension Maternal Grandmother   . Heart attack Maternal Grandmother     Social History Social History    Substance Use Topics  . Smoking status: Never Smoker  . Smokeless tobacco: Never Used  . Alcohol use No     Allergies   Review of patient's allergies indicates no known allergies.  Review of Systems Review of Systems  Constitutional: Negative for chills and fever.  HENT: Positive for congestion, sinus pressure, sneezing and sore throat. Negative for rhinorrhea.   Eyes: Negative for redness and visual disturbance.  Respiratory: Negative for shortness of breath and wheezing.   Cardiovascular: Negative for chest pain and palpitations.  Gastrointestinal: Negative for nausea and vomiting.  Genitourinary: Negative for dysuria and urgency.  Musculoskeletal: Negative for arthralgias and myalgias.  Skin: Negative for pallor and wound.  Neurological: Negative for dizziness and headaches.   Physical Exam Updated Vital Signs BP 124/82 (BP Location: Left Arm)   Pulse 90   Temp 98.3 F (36.8 C) (Oral)   Resp 18   LMP 11/26/2015 (Approximate)   SpO2 98%   Physical Exam  Constitutional: She is oriented to person, place, and time. She appears well-developed and well-nourished. No distress.  HENT:  Head: Normocephalic and atraumatic.  TM normal bilaterally Postnasal drip Tenderness worse in left maxillary sinus   Eyes: EOM are normal. Pupils are equal, round, and reactive to light.  Neck: Normal range of motion. Neck supple.  Cardiovascular: Normal rate and regular  rhythm.  Exam reveals no gallop and no friction rub.   No murmur heard. Pulmonary/Chest: Effort normal. She has no wheezes. She has no rales.  Abdominal: Soft. She exhibits no distension. There is no tenderness.  Musculoskeletal: She exhibits no edema or tenderness.  Neurological: She is alert and oriented to person, place, and time.  Skin: Skin is warm and dry. She is not diaphoretic.  Psychiatric: She has a normal mood and affect. Her behavior is normal.  Nursing note and vitals reviewed.   ED Treatments / Results   DIAGNOSTIC STUDIES:  Oxygen Saturation is 98% on RA, normal by my interpretation.    COORDINATION OF CARE:  1:10 AM Discussed treatment plan with pt at bedside and pt agreed to plan.   Labs (all labs ordered are listed, but only abnormal results are displayed) Labs Reviewed - No data to display  EKG  EKG Interpretation None       Radiology No results found.  Procedures Procedures (including critical care time)  Medications Ordered in ED Medications - No data to display   Initial Impression / Assessment and Plan / ED Course  I have reviewed the triage vital signs and the nursing notes.  Pertinent labs & imaging results that were available during my care of the patient were reviewed by me and considered in my medical decision making (see chart for details).  Clinical Course    25 yo F with URI.  Going on since yesterday.  Severe facial pain per patient.  Will treat with abx.    1:25 AM:  I have discussed the diagnosis/risks/treatment options with the patient and believe the pt to be eligible for discharge home to follow-up with PCP. We also discussed returning to the ED immediately if new or worsening sx occur. We discussed the sx which are most concerning (e.g., sudden worsening pain, fever, inability to tolerate by mouth) that necessitate immediate return. Medications administered to the patient during their visit and any new prescriptions provided to the patient are listed below.  Medications given during this visit Medications - No data to display   The patient appears reasonably screen and/or stabilized for discharge and I doubt any other medical condition or other [facility] requiring further screening, evaluation, or treatment in the ED at this time prior to discharge.    Final Clinical Impressions(s) / ED Diagnoses   Final diagnoses:  Acute maxillary sinusitis, recurrence not specified   I personally performed the services described in this documentation, which  was scribed in my presence. The recorded information has been reviewed and is accurate.    New Prescriptions New Prescriptions   AMOXICILLIN-CLAVULANATE (AUGMENTIN) 875-125 MG TABLET    Take 1 tablet by mouth 2 (two) times daily. One po bid x 7 days     Melene Plan, DO 12/29/15 0125

## 2015-12-29 NOTE — Discharge Instructions (Signed)
Sudafed works the best for this!

## 2016-11-21 ENCOUNTER — Emergency Department (HOSPITAL_COMMUNITY)
Admission: EM | Admit: 2016-11-21 | Discharge: 2016-11-21 | Disposition: A | Discharge status: Home / Self Care | Payer: PRIVATE HEALTH INSURANCE | Attending: Emergency Medicine | Admitting: Emergency Medicine

## 2016-11-21 ENCOUNTER — Encounter (HOSPITAL_COMMUNITY): Payer: Self-pay | Admitting: Emergency Medicine

## 2016-11-21 DIAGNOSIS — J3089 Other allergic rhinitis: Secondary | ICD-10-CM | POA: Diagnosis not present

## 2016-11-21 DIAGNOSIS — J00 Acute nasopharyngitis [common cold]: Secondary | ICD-10-CM | POA: Insufficient documentation

## 2016-11-21 DIAGNOSIS — J029 Acute pharyngitis, unspecified: Secondary | ICD-10-CM | POA: Diagnosis not present

## 2016-11-21 DIAGNOSIS — Z79899 Other long term (current) drug therapy: Secondary | ICD-10-CM | POA: Diagnosis not present

## 2016-11-21 MED ORDER — NAPROXEN 250 MG PO TABS: 250.0000 mg | ORAL_TABLET | Freq: Two times a day (BID) | ORAL | 0 refills | Status: DC

## 2016-11-21 MED ORDER — CETIRIZINE HCL 10 MG PO TABS: 10.0000 mg | ORAL_TABLET | Freq: Every day | ORAL | 1 refills | Status: DC

## 2016-11-21 MED ORDER — FLUTICASONE PROPIONATE 50 MCG/ACT NA SUSP: 2.0000 | Freq: Every day | NASAL | 0 refills | Status: DC

## 2016-11-21 NOTE — ED Notes (Signed)
Bed: WTR8 Expected date:  Expected time:  Means of arrival:  Comments: 

## 2016-11-21 NOTE — ED Triage Notes (Signed)
Pt reports burning throat pain x 3 days. Tx with OTC meds. No swelling, and minimal redness noted in th posterior pharnx Denies fever. Tolerating small amounts of liquid by mouth.

## 2016-11-21 NOTE — ED Provider Notes (Signed)
[facility] DEPT Provider Note   CSN: [id number] Arrival date & time: 11/21/16  1306  By signing my name below, I, Deland Pretty, attest that this documentation has been prepared under the direction and in the presence of Everlene Farrier, PA-C. Electronically Signed: Deland Pretty, ED Scribe. 11/21/16. 2:07 PM.  History   Chief Complaint Chief Complaint  Patient presents with  . Sore Throat   The history is provided by the patient and medical records. No language interpreter was used.   HPI Comments: Gabrielle Huff is a 26 y.o. female who presents to the Emergency Department complaining of gradually worsening, persistent sore throat with associated sneezing, nasal congestion, postnasal drip and runny nose.. The pt took some tylenol with inadequate relief. Swallowing exacerbates her pain, and her symptoms worsen after waking up in the morning. The pt denies fevers, chills and ear pain, trouble swallowing, neck pain, ear discharge, cough, or SOB.   Past Medical History:  Diagnosis Date  . Anemia   . Dysmenorrhea   . Heart murmur     There are no active problems to display for this patient.   Past Surgical History:  Procedure Laterality Date  . CYST REMOVAL HAND      OB History    Gravida Para Term Preterm AB Living   0 0 0 0 0 0   SAB TAB Ectopic Multiple Live Births   0 0 0 0         Home Medications    Prior to Admission medications   Medication Sig Start Date End Date Taking? Authorizing Provider  amoxicillin-clavulanate (AUGMENTIN) 875-125 MG tablet Take 1 tablet by mouth 2 (two) times daily. One po bid x 7 days 12/29/15   Melene Plan, DO  cetirizine (ZYRTEC ALLERGY) 10 MG tablet Take 1 tablet (10 mg total) by mouth daily. 11/21/16   Everlene Farrier, PA-C  fluticasone (FLONASE) 50 MCG/ACT nasal spray Place 2 sprays into both nostrils daily. 11/21/16   Everlene Farrier, PA-C  metroNIDAZOLE (METROGEL) 0.75 % vaginal gel Apply applicatorful vaginally bid for 5 days  06/22/15   Verner Chol, CNM  naproxen (NAPROSYN) 250 MG tablet Take 1 tablet (250 mg total) by mouth 2 (two) times daily with a meal. 11/21/16   Everlene Farrier, PA-C  Norethindrone Acetate-Ethinyl Estrad-FE (LOESTRIN 24 FE) 1-20 MG-MCG(24) tablet Take 1 tablet by mouth daily. 08/22/15   Verner Chol, CNM    Family History Family History  Problem Relation Age of Onset  . Hypertension Maternal Grandmother   . Heart attack Maternal Grandmother   . Hypertension Mother     Social History Social History  Substance Use Topics  . Smoking status: Never Smoker  . Smokeless tobacco: Never Used  . Alcohol use No     Allergies   Patient has no known allergies.   Review of Systems Review of Systems  Constitutional: Negative for chills and fever.  HENT: Positive for congestion, postnasal drip, rhinorrhea, sinus pain, sneezing and sore throat. Negative for drooling, ear discharge, ear pain, facial swelling, mouth sores, trouble swallowing and voice change.   Eyes: Negative for pain.  Respiratory: Negative for cough and shortness of breath.   Musculoskeletal: Negative for neck pain.  Skin: Negative for wound.     Physical Exam Updated Vital Signs BP 112/68 (BP Location: Left Arm)   Pulse 81   Temp 98.8 F (37.1 C) (Oral)   Resp 16   Wt 47.2 kg (104 lb)   LMP 11/14/2016 (Approximate)  SpO2 98%   BMI 17.99 kg/m   Physical Exam  Constitutional: She appears well-developed and well-nourished. No distress.  Nontoxic appearing.  HENT:  Head: Normocephalic and atraumatic.  Right Ear: External ear normal.  Left Ear: External ear normal.  Mouth/Throat: Uvula is midline. No trismus in the jaw. No oropharyngeal exudate. No tonsillar exudate.  No TM erythema or loss of landmarks bilaterally. Rhinorrhea and boggy nasal turbinates bilaterally. No PTA, no trismus and no drooling. No tonsillar hypertrophy or exudates. Mild posterior oropharyngeal erythema without edema. Evidence  of postnasal drip. Tongue protusion is normal.  Eyes: Pupils are equal, round, and reactive to light. Conjunctivae are normal. Right eye exhibits no discharge. Left eye exhibits no discharge.  Neck: Normal range of motion. Neck supple.  Cardiovascular: Normal rate, regular rhythm, normal heart sounds and intact distal pulses.  Exam reveals no gallop and no friction rub.   No murmur heard. Pulmonary/Chest: Effort normal and breath sounds normal. No stridor. No respiratory distress. She has no wheezes. She has no rales.  Abdominal: Soft. There is no tenderness.  Lymphadenopathy:    She has no cervical adenopathy.  Neurological: She is alert. Coordination normal.  Skin: Skin is warm and dry. No rash noted. She is not diaphoretic. No erythema.  Psychiatric: She has a normal mood and affect. Her behavior is normal.  Nursing note and vitals reviewed.    ED Treatments / Results   DIAGNOSTIC STUDIES: Oxygen Saturation is 98% on RA, normal by my interpretation.   COORDINATION OF CARE: 1:55 PM-Discussed next steps with pt. Pt verbalized understanding and is agreeable with the plan.   Labs (all labs ordered are listed, but only abnormal results are displayed) Labs Reviewed - No data to display  EKG  EKG Interpretation None       Radiology No results found.  Procedures Procedures (including critical care time)  Medications Ordered in ED Medications - No data to display   Initial Impression / Assessment and Plan / ED Course  I have reviewed the triage vital signs and the nursing notes.  Pertinent labs & imaging results that were available during my care of the patient were reviewed by me and considered in my medical decision making (see chart for details).    This  is a 26 y.o. female who presents to the Emergency Department complaining of gradually worsening, persistent sore throat with associated sneezing, nasal congestion, postnasal drip and runny nose. No fevers Or trouble  swallowing. On exam the patient is afebrile and nontoxic appearing. She is rhinorrhea and boggy nasal turbinates bilaterally. She has mild posterior oropharyngeal erythema without edema. No tonsillar hypertrophy or exudates. Patient with upper respiratory infection and postnasal drip. This is likely the cause of her sore throat. Viral sore throat. Will discharge her Flonase, Zyrtec and naproxen. Return precautions discussed. I advised the patient to follow-up with their primary care provider this week. I advised the patient to return to the emergency department with new or worsening symptoms or new concerns. The patient verbalized understanding and agreement with plan.    Final Clinical Impressions(s) / ED Diagnoses   Final diagnoses:  Sore throat  Acute nasopharyngitis  Seasonal allergic rhinitis due to other allergic trigger    New Prescriptions New Prescriptions   CETIRIZINE (ZYRTEC ALLERGY) 10 MG TABLET    Take 1 tablet (10 mg total) by mouth daily.   FLUTICASONE (FLONASE) 50 MCG/ACT NASAL SPRAY    Place 2 sprays into both nostrils daily.   NAPROXEN (  NAPROSYN) 250 MG TABLET    Take 1 tablet (250 mg total) by mouth 2 (two) times daily with a meal.   I personally performed the services described in this documentation, which was scribed in my presence. The recorded information has been reviewed and is accurate.       Everlene FarrierDansie, Jayden Rudge, PA-C 11/21/16 1408    Mancel BaleWentz, Elliott, MD 11/22/16 [id number]

## 2016-12-17 ENCOUNTER — Encounter: Payer: Self-pay | Admitting: Certified Nurse Midwife

## 2016-12-17 ENCOUNTER — Ambulatory Visit: Payer: PRIVATE HEALTH INSURANCE | Admitting: Certified Nurse Midwife

## 2016-12-17 NOTE — Progress Notes (Deleted)
26 y.o. G0P0000 Single  African American Fe here for annual exam.    No LMP recorded.          Sexually active: {yes no:314532}  The current method of family planning is {contraception:315051}.    Exercising: {yes UU:828003}  {types:19826} Smoker:  {YES NO:22349}  Health Maintenance: Pap:  2014 neg, 06-20-15 neg History of Abnormal Pap: {YES NO:22349} MMG:  none Self Breast exams: {YES NO:22349} Colonoscopy:  none BMD:   none TDaP:  2013 Shingles: no Pneumonia: no Hep C and HIV: both neg 2017 Labs: ***   reports that she has never smoked. She has never used smokeless tobacco. She reports that she does not drink alcohol or use drugs.  Past Medical History:  Diagnosis Date  . Anemia   . Dysmenorrhea   . Heart murmur     Past Surgical History:  Procedure Laterality Date  . CYST REMOVAL HAND      Current Outpatient Prescriptions  Medication Sig Dispense Refill  . amoxicillin-clavulanate (AUGMENTIN) 875-125 MG tablet Take 1 tablet by mouth 2 (two) times daily. One po bid x 7 days 14 tablet 0  . cetirizine (ZYRTEC ALLERGY) 10 MG tablet Take 1 tablet (10 mg total) by mouth daily. 30 tablet 1  . fluticasone (FLONASE) 50 MCG/ACT nasal spray Place 2 sprays into both nostrils daily. 16 g 0  . metroNIDAZOLE (METROGEL) 0.75 % vaginal gel Apply applicatorful vaginally bid for 5 days 70 g 0  . naproxen (NAPROSYN) 250 MG tablet Take 1 tablet (250 mg total) by mouth 2 (two) times daily with a meal. 30 tablet 0  . Norethindrone Acetate-Ethinyl Estrad-FE (LOESTRIN 24 FE) 1-20 MG-MCG(24) tablet Take 1 tablet by mouth daily. 3 Package 4   No current facility-administered medications for this visit.     Family History  Problem Relation Age of Onset  . Hypertension Maternal Grandmother   . Heart attack Maternal Grandmother   . Hypertension Mother     ROS:  Pertinent items are noted in HPI.  Otherwise, a comprehensive ROS was negative.  Exam:   There were no vitals taken for this  visit.   Ht Readings from Last 3 Encounters:  06/20/15 5' 3.75" (1.619 m)  12/31/13 5\' 4"  (1.626 m)    General appearance: alert, cooperative and appears stated age Head: Normocephalic, without obvious abnormality, atraumatic Neck: no adenopathy, supple, symmetrical, trachea midline and thyroid {EXAM; THYROID:18604} Lungs: clear to auscultation bilaterally Breasts: {Exam; breast:13139::"normal appearance, no masses or tenderness"} Heart: regular rate and rhythm Abdomen: soft, non-tender; no masses,  no organomegaly Extremities: extremities normal, atraumatic, no cyanosis or edema Skin: Skin color, texture, turgor normal. No rashes or lesions Lymph nodes: Cervical, supraclavicular, and axillary nodes normal. No abnormal inguinal nodes palpated Neurologic: Grossly normal   Pelvic: External genitalia:  no lesions              Urethra:  normal appearing urethra with no masses, tenderness or lesions              Bartholin's and Skene's: normal                 Vagina: normal appearing vagina with normal color and discharge, no lesions              Cervix: {exam; cervix:14595}              Pap taken: {yes no:314532} Bimanual Exam:  Uterus:  {exam; uterus:12215}  Adnexa: {exam; adnexa:12223}               Rectovaginal: Confirms               Anus:  normal sphincter tone, no lesions  Chaperone present: ***  A:  Well Woman with normal exam  P:   Reviewed health and wellness pertinent to exam  Pap smear: {YES NO:22349}  {plan; gyn:5269::"mammogram","pap smear","return annually or prn"}  An After Visit Summary was printed and given to the patient.

## 2016-12-19 ENCOUNTER — Ambulatory Visit: Payer: PRIVATE HEALTH INSURANCE | Admitting: Certified Nurse Midwife

## 2016-12-19 ENCOUNTER — Encounter: Payer: Self-pay | Admitting: Certified Nurse Midwife

## 2016-12-19 NOTE — Progress Notes (Deleted)
26 y.o. G0P0000 Single  African American Fe here for annual exam.    No LMP recorded.          Sexually active: {yes no:314532}  The current method of family planning is {contraception:315051}.    Exercising: {yes UJ:811914}no:314532}  {types:19826} Smoker:  {YES NO:22349}  Health Maintenance: Pap:  2014 neg, 06-20-15 neg History of Abnormal Pap: {YES NO:22349} MMG:  none Self Breast exams: {YES NO:22349} Colonoscopy:  none BMD:   none TDaP:  2013 Shingles: no Pneumonia: no Hep C and HIV: both neg 2017 Labs: ***   reports that she has never smoked. She has never used smokeless tobacco. She reports that she does not drink alcohol or use drugs.  Past Medical History:  Diagnosis Date  . Anemia   . Dysmenorrhea   . Heart murmur   . HSV-1 infection     Past Surgical History:  Procedure Laterality Date  . CYST REMOVAL HAND      Current Outpatient Prescriptions  Medication Sig Dispense Refill  . amoxicillin-clavulanate (AUGMENTIN) 875-125 MG tablet Take 1 tablet by mouth 2 (two) times daily. One po bid x 7 days 14 tablet 0  . cetirizine (ZYRTEC ALLERGY) 10 MG tablet Take 1 tablet (10 mg total) by mouth daily. 30 tablet 1  . fluticasone (FLONASE) 50 MCG/ACT nasal spray Place 2 sprays into both nostrils daily. 16 g 0  . metroNIDAZOLE (METROGEL) 0.75 % vaginal gel Apply applicatorful vaginally bid for 5 days 70 g 0  . naproxen (NAPROSYN) 250 MG tablet Take 1 tablet (250 mg total) by mouth 2 (two) times daily with a meal. 30 tablet 0  . Norethindrone Acetate-Ethinyl Estrad-FE (LOESTRIN 24 FE) 1-20 MG-MCG(24) tablet Take 1 tablet by mouth daily. 3 Package 4   No current facility-administered medications for this visit.     Family History  Problem Relation Age of Onset  . Hypertension Maternal Grandmother   . Heart attack Maternal Grandmother   . Hypertension Mother     ROS:  Pertinent items are noted in HPI.  Otherwise, a comprehensive ROS was negative.  Exam:   There were no  vitals taken for this visit.   Ht Readings from Last 3 Encounters:  06/20/15 5' 3.75" (1.619 m)  12/31/13 5\' 4"  (1.626 m)    General appearance: alert, cooperative and appears stated age Head: Normocephalic, without obvious abnormality, atraumatic Neck: no adenopathy, supple, symmetrical, trachea midline and thyroid {EXAM; THYROID:18604} Lungs: clear to auscultation bilaterally Breasts: {Exam; breast:13139::"normal appearance, no masses or tenderness"} Heart: regular rate and rhythm Abdomen: soft, non-tender; no masses,  no organomegaly Extremities: extremities normal, atraumatic, no cyanosis or edema Skin: Skin color, texture, turgor normal. No rashes or lesions Lymph nodes: Cervical, supraclavicular, and axillary nodes normal. No abnormal inguinal nodes palpated Neurologic: Grossly normal   Pelvic: External genitalia:  no lesions              Urethra:  normal appearing urethra with no masses, tenderness or lesions              Bartholin's and Skene's: normal                 Vagina: normal appearing vagina with normal color and discharge, no lesions              Cervix: {exam; cervix:14595}              Pap taken: {yes no:314532} Bimanual Exam:  Uterus:  {exam; uterus:12215}  Adnexa: {exam; adnexa:12223}               Rectovaginal: Confirms               Anus:  normal sphincter tone, no lesions  Chaperone present: ***  A:  Well Woman with normal exam  P:   Reviewed health and wellness pertinent to exam  Pap smear: {YES NO:22349}  {plan; gyn:5269::"mammogram","pap smear","return annually or prn"}  An After Visit Summary was printed and given to the patient.

## 2017-05-12 ENCOUNTER — Encounter: Payer: Self-pay | Admitting: Certified Nurse Midwife

## 2017-07-13 ENCOUNTER — Other Ambulatory Visit: Payer: Self-pay

## 2017-07-13 ENCOUNTER — Emergency Department (HOSPITAL_COMMUNITY)
Admission: EM | Admit: 2017-07-13 | Discharge: 2017-07-14 | Disposition: A | Discharge status: Home / Self Care | Payer: PRIVATE HEALTH INSURANCE | Attending: Emergency Medicine | Admitting: Emergency Medicine

## 2017-07-13 ENCOUNTER — Encounter (HOSPITAL_COMMUNITY): Payer: Self-pay | Admitting: Emergency Medicine

## 2017-07-13 DIAGNOSIS — D509 Iron deficiency anemia, unspecified: Secondary | ICD-10-CM

## 2017-07-13 DIAGNOSIS — O99011 Anemia complicating pregnancy, first trimester: Secondary | ICD-10-CM | POA: Insufficient documentation

## 2017-07-13 DIAGNOSIS — O9989 Other specified diseases and conditions complicating pregnancy, childbirth and the puerperium: Secondary | ICD-10-CM | POA: Insufficient documentation

## 2017-07-13 DIAGNOSIS — R519 Headache, unspecified: Secondary | ICD-10-CM

## 2017-07-13 DIAGNOSIS — R51 Headache: Secondary | ICD-10-CM | POA: Insufficient documentation

## 2017-07-13 DIAGNOSIS — Z3A10 10 weeks gestation of pregnancy: Secondary | ICD-10-CM | POA: Diagnosis not present

## 2017-07-13 DIAGNOSIS — Z3491 Encounter for supervision of normal pregnancy, unspecified, first trimester: Secondary | ICD-10-CM

## 2017-07-13 NOTE — ED Triage Notes (Signed)
Pt reports having headache and fever that started this morning. Pt also reports abdominal cramping to left upper and lower abdomen. Pt report being [redacted] weeks pregnant.

## 2017-07-14 LAB — CBC WITH DIFFERENTIAL/PLATELET
BASOS ABS: 0 10*3/uL (ref 0.0–0.1)
Basophils Relative: 0 %
Eosinophils Absolute: 0.1 10*3/uL (ref 0.0–0.7)
Eosinophils Relative: 1 %
HEMATOCRIT: 31.2 % — AB (ref 36.0–46.0)
Hemoglobin: 9.4 g/dL — ABNORMAL LOW (ref 12.0–15.0)
Lymphocytes Relative: 18 %
Lymphs Abs: 1.4 10*3/uL (ref 0.7–4.0)
MCH: 19.9 pg — ABNORMAL LOW (ref 26.0–34.0)
MCHC: 30.1 g/dL (ref 30.0–36.0)
MCV: 66.1 fL — AB (ref 78.0–100.0)
MONOS PCT: 10 %
Monocytes Absolute: 0.8 10*3/uL (ref 0.1–1.0)
NEUTROS PCT: 71 %
Neutro Abs: 5.4 10*3/uL (ref 1.7–7.7)
PLATELETS: 305 10*3/uL (ref 150–400)
RBC: 4.72 MIL/uL (ref 3.87–5.11)
RDW: 15.3 % (ref 11.5–15.5)
WBC: 7.7 10*3/uL (ref 4.0–10.5)

## 2017-07-14 LAB — COMPREHENSIVE METABOLIC PANEL
ALT: 8 U/L — ABNORMAL LOW (ref 14–54)
ANION GAP: 6 (ref 5–15)
AST: 17 U/L (ref 15–41)
Albumin: 3.2 g/dL — ABNORMAL LOW (ref 3.5–5.0)
Alkaline Phosphatase: 46 U/L (ref 38–126)
BILIRUBIN TOTAL: 0.4 mg/dL (ref 0.3–1.2)
BUN: 5 mg/dL — ABNORMAL LOW (ref 6–20)
CHLORIDE: 106 mmol/L (ref 101–111)
CO2: 23 mmol/L (ref 22–32)
Calcium: 8.5 mg/dL — ABNORMAL LOW (ref 8.9–10.3)
Creatinine, Ser: 0.39 mg/dL — ABNORMAL LOW (ref 0.44–1.00)
Glucose, Bld: 86 mg/dL (ref 65–99)
POTASSIUM: 3.7 mmol/L (ref 3.5–5.1)
Sodium: 135 mmol/L (ref 135–145)
TOTAL PROTEIN: 6.8 g/dL (ref 6.5–8.1)

## 2017-07-14 LAB — URINALYSIS, ROUTINE W REFLEX MICROSCOPIC
Bilirubin Urine: NEGATIVE
GLUCOSE, UA: NEGATIVE mg/dL
Hgb urine dipstick: NEGATIVE
Ketones, ur: 5 mg/dL — AB
LEUKOCYTES UA: NEGATIVE
NITRITE: NEGATIVE
Protein, ur: NEGATIVE mg/dL
Specific Gravity, Urine: 1.019 (ref 1.005–1.030)
pH: 6 (ref 5.0–8.0)

## 2017-07-14 MED ORDER — METOCLOPRAMIDE HCL 10 MG PO TABS: 10.0000 mg | ORAL_TABLET | Freq: Four times a day (QID) | ORAL | 0 refills | Status: DC | PRN

## 2017-07-14 MED ORDER — DIPHENHYDRAMINE HCL 50 MG/ML IJ SOLN
25.0000 mg | Freq: Once | INTRAMUSCULAR | Status: AC
  Administered 2017-07-14: 25 mg via INTRAVENOUS
  Filled 2017-07-14: qty 1

## 2017-07-14 MED ORDER — SODIUM CHLORIDE 0.9 % IV BOLUS (SEPSIS)
1000.0000 mL | Freq: Once | INTRAVENOUS | Status: AC
  Administered 2017-07-14: 1000 mL via INTRAVENOUS

## 2017-07-14 MED ORDER — METOCLOPRAMIDE HCL 5 MG/ML IJ SOLN
10.0000 mg | Freq: Once | INTRAMUSCULAR | Status: AC
Start: 2017-07-14 — End: 2017-07-14
  Administered 2017-07-14: 10 mg via INTRAVENOUS
  Filled 2017-07-14: qty 2

## 2017-07-14 MED ORDER — PRENATAL COMPLETE 14-0.4 MG PO TABS: 1.0000 | ORAL_TABLET | Freq: Every day | ORAL | 0 refills | Status: DC

## 2017-07-14 NOTE — Discharge Instructions (Addendum)
Take acetaminophen as needed for pain. Do not take ibuprofen or naproxen while you are pregnant.

## 2017-07-14 NOTE — ED Provider Notes (Signed)
[facility]-EMERGENCY DEPT Provider Note   CSN: [id number] Arrival date & time: 07/13/17  2226     History   Chief Complaint Chief Complaint  Patient presents with  . Headache  . Abdominal Pain    HPI Gabrielle Huff is a 27 y.o. female.  The history is provided by the patient.  She is about [redacted] weeks pregnant and comes in with onset this morning of a bifrontal headache.  Headache is throbbing in nature and she rates it at 10/10.  There is no visual disturbance.  There is photophobia and phonophobia.  She states she was running a fever at home, but temperature was only up to 99.0.  She had some chills but no sweats.  There has been some generalized abdominal cramping which she relates to her headache.  Pregnancy has been uncomplicated to this point.  She has not had similar headaches in the past.  She has not taken anything for her headache.  Past Medical History:  Diagnosis Date  . Anemia   . Dysmenorrhea   . Heart murmur   . HSV-1 infection     There are no active problems to display for this patient.   Past Surgical History:  Procedure Laterality Date  . CYST REMOVAL HAND       OB History    Gravida  1   Para  0   Term  0   Preterm  0   AB  0   Living  0     SAB  0   TAB  0   Ectopic  0   Multiple  0   Live Births               Home Medications    Prior to Admission medications   Medication Sig Start Date End Date Taking? Authorizing Provider  cetirizine (ZYRTEC ALLERGY) 10 MG tablet Take 1 tablet (10 mg total) by mouth daily. Patient not taking: Reported on 07/14/2017 11/21/16   Everlene Farrier, PA-C  fluticasone [facility]) 50 MCG/ACT nasal spray Place 2 sprays into both nostrils daily. Patient not taking: Reported on 07/14/2017 11/21/16   Everlene Farrier, PA-C  naproxen (NAPROSYN) 250 MG tablet Take 1 tablet (250 mg total) by mouth 2 (two) times daily with a meal. Patient not taking: Reported on 07/14/2017 11/21/16   Everlene Farrier, PA-C  Norethindrone Acetate-Ethinyl Estrad-FE (LOESTRIN 24 FE) 1-20 MG-MCG(24) tablet Take 1 tablet by mouth daily. Patient not taking: Reported on 07/14/2017 08/22/15   Verner Chol, CNM    Family History Family History  Problem Relation Age of Onset  . Hypertension Maternal Grandmother   . Heart attack Maternal Grandmother   . Hypertension Mother     Social History Social History   Tobacco Use  . Smoking status: Never Smoker  . Smokeless tobacco: Never Used  Substance Use Topics  . Alcohol use: No  . Drug use: No     Allergies   Patient has no known allergies.   Review of Systems Review of Systems  All other systems reviewed and are negative.    Physical Exam Updated Vital Signs BP 97/63 (BP Location: Left Arm)   Pulse 83   Temp 99.1 F (37.3 C) (Oral)   Resp 18   Ht 5\' 4"  (1.626 m)   Wt 45.8 kg (101 lb)   LMP 05/01/2017   SpO2 100%   BMI 17.34 kg/m   Physical Exam  Nursing note and vitals reviewed.  26  year old female, resting comfortably and in no acute distress. Vital signs are normal. Oxygen saturation is 199%, which is normal. Head is normocephalic and atraumatic. PERRLA, EOMI. Oropharynx is clear.  There is tenderness palpation over her temporalis muscles bilaterally and over the insertion of paracervical muscles bilaterally. Neck is nontender and supple without adenopathy or JVD. Back is nontender and there is no CVA tenderness. Lungs are clear without rales, wheezes, or rhonchi. Chest is nontender. Heart has regular rate and rhythm without murmur. Abdomen is soft, flat, with mild tenderness diffusely.  There is no rebound or guarding.  There are no masses or hepatosplenomegaly and peristalsis is hypoactive. Extremities have no cyanosis or edema, full range of motion is present. Skin is warm and dry without rash. Neurologic: Mental status is normal, cranial nerves are intact, there are no motor or sensory deficits.  ED Treatments /  Results  Labs (all labs ordered are listed, but only abnormal results are displayed) Labs Reviewed  COMPREHENSIVE METABOLIC PANEL - Abnormal; Notable for the following components:      Result Value   BUN 5 (*)    Creatinine, Ser 0.39 (*)    Calcium 8.5 (*)    Albumin 3.2 (*)    ALT 8 (*)    All other components within normal limits  CBC WITH DIFFERENTIAL/PLATELET - Abnormal; Notable for the following components:   Hemoglobin 9.4 (*)    HCT 31.2 (*)    MCV 66.1 (*)    MCH 19.9 (*)    All other components within normal limits  URINALYSIS, ROUTINE W REFLEX MICROSCOPIC - Abnormal; Notable for the following components:   Ketones, ur 5 (*)    All other components within normal limits   Procedures Procedures  Medications Ordered in ED Medications  sodium chloride 0.9 % bolus 1,000 mL (0 mLs Intravenous Stopped 07/14/17 0532)  metoCLOPramide (REGLAN) injection 10 mg (10 mg Intravenous Given 07/14/17 0313)  diphenhydrAMINE (BENADRYL) injection 25 mg (25 mg Intravenous Given 07/14/17 [id number])     Initial Impression / Assessment and Plan / ED Course  I have reviewed the triage vital signs and the nursing notes.  Pertinent labs & imaging results that were available during my care of the patient were reviewed by me and considered in my medical decision making (see chart for details).  Headache which appears to be a migraine variant, possible muscle contraction headache.  No red flags to suggest serious illnesses such as meningitis or subarachnoid hemorrhage.  Because of abdominal pain, will check screening labs and urinalysis.  She will be given headache cocktail of normal saline, metoclopramide, diphenhydramine.  Old records are reviewed, and she has no relevant past visits.  Laboratory work-up is significant for microcytic anemia which is probably iron deficiency.  Anemia had been noted previously.  She feels much better after above noted medications.  She is discharged with prescription for  metoclopramide, and also prenatal vitamins.  Return precautions discussed..  Final Clinical Impressions(s) / ED Diagnoses   Final diagnoses:  Bad headache  First trimester pregnancy  Microcytic anemia    ED Discharge Orders        Ordered    Prenatal Vit-Fe Fumarate-FA (PRENATAL COMPLETE) 14-0.4 MG TABS  Daily     07/14/17 0523    metoCLOPramide (REGLAN) 10 MG tablet  Every 6 hours PRN     07/14/17 0523       Dione BoozeGlick, Kemal Amores, MD 07/14/17 [id number]

## 2017-08-21 ENCOUNTER — Encounter: Payer: Self-pay | Admitting: Certified Nurse Midwife

## 2017-08-21 ENCOUNTER — Other Ambulatory Visit (HOSPITAL_COMMUNITY)
Admission: RE | Admit: 2017-08-21 | Discharge: 2017-08-21 | Disposition: A | Discharge status: Home / Self Care | Payer: PRIVATE HEALTH INSURANCE | Source: Ambulatory Visit | Attending: Certified Nurse Midwife | Admitting: Certified Nurse Midwife

## 2017-08-21 ENCOUNTER — Ambulatory Visit (INDEPENDENT_AMBULATORY_CARE_PROVIDER_SITE_OTHER): Payer: PRIVATE HEALTH INSURANCE | Admitting: Certified Nurse Midwife

## 2017-08-21 VITALS — BP 97/68 | HR 109 | Ht 64.0 in | Wt 101.0 lb

## 2017-08-21 DIAGNOSIS — O0930 Supervision of pregnancy with insufficient antenatal care, unspecified trimester: Secondary | ICD-10-CM

## 2017-08-21 DIAGNOSIS — Z3400 Encounter for supervision of normal first pregnancy, unspecified trimester: Secondary | ICD-10-CM | POA: Insufficient documentation

## 2017-08-21 DIAGNOSIS — M419 Scoliosis, unspecified: Secondary | ICD-10-CM

## 2017-08-21 DIAGNOSIS — Z3402 Encounter for supervision of normal first pregnancy, second trimester: Secondary | ICD-10-CM | POA: Diagnosis not present

## 2017-08-21 DIAGNOSIS — Z8489 Family history of other specified conditions: Secondary | ICD-10-CM

## 2017-08-21 DIAGNOSIS — Z8679 Personal history of other diseases of the circulatory system: Secondary | ICD-10-CM

## 2017-08-21 DIAGNOSIS — O0932 Supervision of pregnancy with insufficient antenatal care, second trimester: Secondary | ICD-10-CM

## 2017-08-21 NOTE — Progress Notes (Signed)
Subjective:   Gabrielle Huff is a 27 y.o. G1P0000 at [redacted]w[redacted]d by LMP being seen today for her first obstetrical visit.  Her obstetrical history is significant for extremly thin body type, scoliosis, patient is a twin, FOB has twins.  Had early [place] at [facility] pregnancy center. Patient does not intend to breast feed. Pregnancy history fully reviewed.  Patient reports no bleeding, no contractions, no cramping, no leaking and vertigo.  HISTORY: OB History  Gravida Para Term Preterm AB Living  1 0 0 0 0 0  SAB TAB Ectopic Multiple Live Births  0 0 0 0 0    # Outcome Date GA Lbr Len/2nd Weight Sex Delivery Anes PTL Lv  1 Current             Last pap smear was done unknown.    Past Medical History:  Diagnosis Date  . Anemia   . Dysmenorrhea   . Heart murmur   . HSV-1 infection    Past Surgical History:  Procedure Laterality Date  . CYST REMOVAL HAND    . NO PAST SURGERIES     Family History  Problem Relation Age of Onset  . Hypertension Maternal Grandmother   . Heart attack Maternal Grandmother   . Hypertension Mother    Social History   Tobacco Use  . Smoking status: Never Smoker  . Smokeless tobacco: Never Used  Substance Use Topics  . Alcohol use: No  . Drug use: No   No Known Allergies Current Outpatient Medications on File Prior to Visit  Medication Sig Dispense Refill  . Prenatal Vit-Fe Fumarate-FA (PRENATAL COMPLETE) 14-0.4 MG TABS Take 1 tablet by mouth daily. 30 each 0  . metoCLOPramide (REGLAN) 10 MG tablet Take 1 tablet (10 mg total) by mouth every 6 (six) hours as needed for nausea (or headache). (Patient not taking: Reported on 08/21/2017) 30 tablet 0   No current facility-administered medications on file prior to visit.     Review of Systems Pertinent items noted in HPI and remainder of comprehensive ROS otherwise negative.  Exam   Vitals:   08/21/17 1105  BP: 97/68  Pulse: (!) 109  Weight: 101 lb (45.8 kg)   Fetal Heart Rate (bpm): 150;  doppler  Uterus:  Fundal Height: 16 cm  Pelvic Exam: Perineum: no hemorrhoids, normal perineum   Vulva: normal external genitalia, no lesions   Vagina:  normal mucosa, normal discharge   Cervix: no lesions and normal, pap smear done.    Adnexa: normal adnexa and no mass, fullness, tenderness   Bony Pelvis: average  System: General: well-developed, well-nourished female in no acute distress   Breast:  normal appearance, no masses or tenderness   Skin: normal coloration and turgor, no rashes   Neurologic: oriented, normal, negative, normal mood   Extremities: normal strength, tone, and muscle mass, ROM of all joints is normal   HEENT PERRLA, extraocular movement intact and sclera clear, anicteric   Mouth/Teeth mucous membranes moist, pharynx normal without lesions and dental hygiene good   Neck supple and no masses   Cardiovascular: regular rate and rhythm   Respiratory:  no respiratory distress, normal breath sounds   Abdomen: soft, non-tender; bowel sounds normal; no masses,  no organomegaly     Assessment:   Pregnancy: G1P0000 Patient Active Problem List   Diagnosis Date Noted  . Supervision of normal first pregnancy, antepartum 08/21/2017  . History of heart murmur in childhood 08/21/2017  . Scoliosis 08/21/2017  .  Family history of identical twins 08/21/2017     Plan:  1. Supervision of normal first pregnancy, antepartum    - Obstetric Panel, Including HIV - Hemoglobin A1c - VITAMIN D 25 Hydroxy (Vit-D Deficiency, Fractures) - Culture, OB Urine - Hemoglobinopathy evaluation - Cytology - PAP - Cervicovaginal ancillary only - [place] MFM OB COMP + 14 WK; Future - Inheritest Core(CF97,SMA,FraX) - AFP, Serum, Open Spina Bifida - Genetic Screening  2. History of heart murmur in childhood    - Ambulatory referral to Cardiology  3. Scoliosis of thoracic spine, unspecified scoliosis type      Did not have brace, will need surgery at a later date  4. Family history of  identical twins     Patient is a twin.     Initial labs drawn. Continue prenatal vitamins. Genetic Screening discussed, NIPS: ordered. Ultrasound discussed; fetal anatomic survey: ordered. Problem list reviewed and updated. The nature of [facility] with multiple MDs and other Advanced Practice Providers was explained to patient; also emphasized that residents, students are part of our team. Routine obstetric precautions reviewed. Return in about 1 month (around 09/18/2017) for ROB.     Orvilla Cornwall, CNM [facility], [facility]

## 2017-08-22 LAB — VITAMIN D 25 HYDROXY (VIT D DEFICIENCY, FRACTURES): VIT D 25 HYDROXY: 11.8 ng/mL — AB (ref 30.0–100.0)

## 2017-08-22 LAB — CYTOLOGY - PAP: DIAGNOSIS: NEGATIVE

## 2017-08-22 LAB — CERVICOVAGINAL ANCILLARY ONLY
BACTERIAL VAGINITIS: POSITIVE — AB
Candida vaginitis: POSITIVE — AB
Chlamydia: NEGATIVE
Neisseria Gonorrhea: NEGATIVE
Trichomonas: NEGATIVE

## 2017-08-22 LAB — OBSTETRIC PANEL, INCLUDING HIV
Antibody Screen: NEGATIVE
Basophils Absolute: 0 10*3/uL (ref 0.0–0.2)
Basos: 0 %
EOS (ABSOLUTE): 0 10*3/uL (ref 0.0–0.4)
Eos: 1 %
HIV Screen 4th Generation wRfx: NONREACTIVE
Hematocrit: 34.8 % (ref 34.0–46.6)
Hemoglobin: 10 g/dL — ABNORMAL LOW (ref 11.1–15.9)
Hepatitis B Surface Ag: NEGATIVE
IMMATURE GRANS (ABS): 0 10*3/uL (ref 0.0–0.1)
IMMATURE GRANULOCYTES: 0 %
LYMPHS: 13 %
Lymphocytes Absolute: 1.1 10*3/uL (ref 0.7–3.1)
MCH: 19.7 pg — ABNORMAL LOW (ref 26.6–33.0)
MCHC: 28.7 g/dL — AB (ref 31.5–35.7)
MCV: 69 fL — ABNORMAL LOW (ref 79–97)
Monocytes Absolute: 0.5 10*3/uL (ref 0.1–0.9)
Monocytes: 6 %
NEUTROS PCT: 80 %
Neutrophils Absolute: 7 10*3/uL (ref 1.4–7.0)
PLATELETS: 296 10*3/uL (ref 150–379)
RBC: 5.08 x10E6/uL (ref 3.77–5.28)
RDW: 16 % — AB (ref 12.3–15.4)
RPR Ser Ql: NONREACTIVE
Rh Factor: POSITIVE
Rubella Antibodies, IGG: 4.15 index (ref >0.99)
WBC: 8.6 10*3/uL (ref 3.4–10.8)

## 2017-08-22 LAB — HEMOGLOBINOPATHY EVALUATION
HEMOGLOBIN A2 QUANTITATION: 1.6 % — AB (ref 1.8–3.2)
HEMOGLOBIN F QUANTITATION: 0 % (ref 0.0–2.0)
HGB C: 0 %
HGB S: 0 %
HGB VARIANT: 0 %
Hgb A: 98.4 % (ref 96.4–98.8)

## 2017-08-22 LAB — HEMOGLOBIN A1C
Est. average glucose Bld gHb Est-mCnc: 88 mg/dL
HEMOGLOBIN A1C: 4.7 % — AB (ref 4.8–5.6)

## 2017-08-23 LAB — CULTURE, OB URINE

## 2017-08-23 LAB — URINE CULTURE, OB REFLEX

## 2017-08-25 ENCOUNTER — Other Ambulatory Visit: Payer: Self-pay | Admitting: Certified Nurse Midwife

## 2017-08-25 ENCOUNTER — Telehealth: Payer: Self-pay

## 2017-08-25 DIAGNOSIS — B3731 Acute candidiasis of vulva and vagina: Secondary | ICD-10-CM

## 2017-08-25 DIAGNOSIS — Z3400 Encounter for supervision of normal first pregnancy, unspecified trimester: Secondary | ICD-10-CM

## 2017-08-25 DIAGNOSIS — B373 Candidiasis of vulva and vagina: Secondary | ICD-10-CM

## 2017-08-25 DIAGNOSIS — E559 Vitamin D deficiency, unspecified: Secondary | ICD-10-CM

## 2017-08-25 DIAGNOSIS — B9689 Other specified bacterial agents as the cause of diseases classified elsewhere: Secondary | ICD-10-CM

## 2017-08-25 DIAGNOSIS — O99012 Anemia complicating pregnancy, second trimester: Secondary | ICD-10-CM | POA: Insufficient documentation

## 2017-08-25 DIAGNOSIS — N760 Acute vaginitis: Secondary | ICD-10-CM

## 2017-08-25 MED ORDER — TERCONAZOLE 0.8 % VA CREA: 1.0000 | TOPICAL_CREAM | Freq: Every day | VAGINAL | 0 refills | Status: DC

## 2017-08-25 MED ORDER — CITRANATAL BLOOM 90-1 MG PO TABS: 1.0000 | ORAL_TABLET | Freq: Every day | ORAL | 12 refills | Status: DC

## 2017-08-25 MED ORDER — FLUCONAZOLE 150 MG PO TABS: 150.0000 mg | ORAL_TABLET | Freq: Once | ORAL | 0 refills | Status: AC

## 2017-08-25 MED ORDER — VITAMIN D (ERGOCALCIFEROL) 1.25 MG (50000 UNIT) PO CAPS: 50000.0000 [IU] | ORAL_CAPSULE | ORAL | 2 refills | Status: DC

## 2017-08-25 MED ORDER — SECNIDAZOLE 2 G PO PACK: 1.0000 | PACK | Freq: Once | ORAL | 0 refills | Status: AC

## 2017-08-25 NOTE — Telephone Encounter (Signed)
S/w pt and advised of results and rx sent by provider. 

## 2017-08-26 ENCOUNTER — Other Ambulatory Visit: Payer: Self-pay | Admitting: Certified Nurse Midwife

## 2017-08-26 LAB — AFP, SERUM, OPEN SPINA BIFIDA
AFP MOM: 0.78
AFP Value: 32.9 ng/mL
Gest. Age on Collection Date: 16 weeks
MATERNAL AGE AT EDD: 24.9 a
OSBR Risk 1 IN: 10000
TEST RESULTS AFP: NEGATIVE
Weight: 101 [lb_av]

## 2017-08-26 LAB — SPECIMEN STATUS REPORT

## 2017-08-26 LAB — FERRITIN: FERRITIN: 9 ng/mL — AB (ref 15–150)

## 2017-08-30 LAB — INHERITEST CORE(CF97,SMA,FRAX)

## 2017-09-01 ENCOUNTER — Other Ambulatory Visit: Payer: Self-pay | Admitting: Certified Nurse Midwife

## 2017-09-01 DIAGNOSIS — Z3400 Encounter for supervision of normal first pregnancy, unspecified trimester: Secondary | ICD-10-CM

## 2017-09-11 ENCOUNTER — Telehealth: Payer: Self-pay

## 2017-09-11 ENCOUNTER — Other Ambulatory Visit: Payer: Self-pay | Admitting: Certified Nurse Midwife

## 2017-09-11 ENCOUNTER — Observation Stay (HOSPITAL_COMMUNITY)
Admission: EM | Admit: 2017-09-11 | Discharge: 2017-09-13 | Disposition: A | Discharge status: Home / Self Care | Payer: Medicaid Other | Attending: Internal Medicine | Admitting: Internal Medicine

## 2017-09-11 ENCOUNTER — Encounter (HOSPITAL_COMMUNITY): Payer: Self-pay | Admitting: Emergency Medicine

## 2017-09-11 ENCOUNTER — Emergency Department (HOSPITAL_COMMUNITY): Payer: Medicaid Other

## 2017-09-11 ENCOUNTER — Other Ambulatory Visit: Payer: Self-pay

## 2017-09-11 ENCOUNTER — Ambulatory Visit (HOSPITAL_COMMUNITY)
Admission: RE | Admit: 2017-09-11 | Discharge: 2017-09-11 | Disposition: A | Discharge status: Home / Self Care | Payer: PRIVATE HEALTH INSURANCE | Source: Ambulatory Visit | Attending: Certified Nurse Midwife | Admitting: Certified Nurse Midwife

## 2017-09-11 DIAGNOSIS — Z363 Encounter for antenatal screening for malformations: Secondary | ICD-10-CM | POA: Diagnosis not present

## 2017-09-11 DIAGNOSIS — R079 Chest pain, unspecified: Secondary | ICD-10-CM | POA: Diagnosis not present

## 2017-09-11 DIAGNOSIS — R0902 Hypoxemia: Secondary | ICD-10-CM | POA: Insufficient documentation

## 2017-09-11 DIAGNOSIS — Z3A19 19 weeks gestation of pregnancy: Secondary | ICD-10-CM | POA: Diagnosis not present

## 2017-09-11 DIAGNOSIS — O99012 Anemia complicating pregnancy, second trimester: Secondary | ICD-10-CM | POA: Insufficient documentation

## 2017-09-11 DIAGNOSIS — E559 Vitamin D deficiency, unspecified: Secondary | ICD-10-CM | POA: Diagnosis not present

## 2017-09-11 DIAGNOSIS — O26892 Other specified pregnancy related conditions, second trimester: Secondary | ICD-10-CM | POA: Diagnosis not present

## 2017-09-11 DIAGNOSIS — D509 Iron deficiency anemia, unspecified: Secondary | ICD-10-CM | POA: Insufficient documentation

## 2017-09-11 DIAGNOSIS — O99282 Endocrine, nutritional and metabolic diseases complicating pregnancy, second trimester: Secondary | ICD-10-CM | POA: Diagnosis not present

## 2017-09-11 DIAGNOSIS — Z79899 Other long term (current) drug therapy: Secondary | ICD-10-CM | POA: Diagnosis not present

## 2017-09-11 DIAGNOSIS — Z8249 Family history of ischemic heart disease and other diseases of the circulatory system: Secondary | ICD-10-CM | POA: Diagnosis not present

## 2017-09-11 DIAGNOSIS — R0602 Shortness of breath: Secondary | ICD-10-CM

## 2017-09-11 DIAGNOSIS — Z3A20 20 weeks gestation of pregnancy: Secondary | ICD-10-CM | POA: Diagnosis not present

## 2017-09-11 DIAGNOSIS — Z3400 Encounter for supervision of normal first pregnancy, unspecified trimester: Secondary | ICD-10-CM

## 2017-09-11 DIAGNOSIS — O99512 Diseases of the respiratory system complicating pregnancy, second trimester: Secondary | ICD-10-CM | POA: Insufficient documentation

## 2017-09-11 DIAGNOSIS — D72829 Elevated white blood cell count, unspecified: Secondary | ICD-10-CM | POA: Diagnosis not present

## 2017-09-11 DIAGNOSIS — R000 Tachycardia, unspecified: Secondary | ICD-10-CM

## 2017-09-11 LAB — CBC
HEMATOCRIT: 29.9 % — AB (ref 36.0–46.0)
HEMOGLOBIN: 8.8 g/dL — AB (ref 12.0–15.0)
MCH: 19.4 pg — ABNORMAL LOW (ref 26.0–34.0)
MCHC: 29.4 g/dL — AB (ref 30.0–36.0)
MCV: 65.9 fL — ABNORMAL LOW (ref 78.0–100.0)
Platelets: 285 10*3/uL (ref 150–400)
RBC: 4.54 MIL/uL (ref 3.87–5.11)
RDW: 15.4 % (ref 11.5–15.5)
WBC: 11.5 10*3/uL — ABNORMAL HIGH (ref 4.0–10.5)

## 2017-09-11 LAB — BASIC METABOLIC PANEL
ANION GAP: 8 (ref 5–15)
BUN: 6 mg/dL (ref 6–20)
CO2: 22 mmol/L (ref 22–32)
Calcium: 8.6 mg/dL — ABNORMAL LOW (ref 8.9–10.3)
Chloride: 105 mmol/L (ref 101–111)
Creatinine, Ser: 0.57 mg/dL (ref 0.44–1.00)
GLUCOSE: 106 mg/dL — AB (ref 65–99)
POTASSIUM: 3.9 mmol/L (ref 3.5–5.1)
Sodium: 135 mmol/L (ref 135–145)

## 2017-09-11 LAB — I-STAT TROPONIN, ED: Troponin i, poc: 0 ng/mL (ref 0.00–0.08)

## 2017-09-11 LAB — HCG, QUANTITATIVE, PREGNANCY: HCG, BETA CHAIN, QUANT, S: 30847 m[IU]/mL — AB (ref <5)

## 2017-09-11 MED ORDER — IOPAMIDOL (ISOVUE-370) INJECTION 76%
INTRAVENOUS | Status: AC
  Filled 2017-09-11: qty 100

## 2017-09-11 MED ORDER — IOPAMIDOL (ISOVUE-370) INJECTION 76%
100.0000 mL | Freq: Once | INTRAVENOUS | Status: AC | PRN
  Administered 2017-09-11: 80 mL via INTRAVENOUS

## 2017-09-11 MED ORDER — SODIUM CHLORIDE 0.9 % IV BOLUS
1000.0000 mL | Freq: Once | INTRAVENOUS | Status: AC
Start: 2017-09-11 — End: 2017-09-11
  Administered 2017-09-11: 1000 mL via INTRAVENOUS

## 2017-09-11 NOTE — ED Provider Notes (Signed)
[facility]-EMERGENCY DEPT Provider Note   CSN: [id number] Arrival date & time: 09/11/17  1456     History   Chief Complaint Chief Complaint  Patient presents with  . Chest Pain    HPI  Gabrielle Huff is a 27 y.o. female.  Who presents the emergency department for chest pain.  Patient was sent in by her OB/GYN.  She states that for the past month she has had progressively worsening symptoms of palpitations, chest pain and pressure with associated dyspnea and feeling as if she is going to pass out.  She states that first the symptoms would only happen about every other day.  She states that when she first sits up from sleep she notices her heart throbbing in her chest with associated pain, pressure and shortness of breath.  She has also had worsening exertional dyspnea.  Patient states that any movement brings on the symptoms in her chest over the past week.  She says that she has had to sit down when she is cooking, laid down in the shower.  She states that her symptoms are only relieved when she lies flat.  As soon as she sits up the symptoms begin again.  When she is ambulating for any distance she feels extremely weak as though she is going to pass out and has to sit down or lie down.  She feels improved when lying flat she also feels improved when taking a deep breath.  The pain is worse on the left..  She denies cough, hemoptysis, unilateral leg swelling. HPI  Past Medical History:  Diagnosis Date  . Anemia   . Dysmenorrhea   . Heart murmur   . HSV-1 infection     Patient Active Problem List   Diagnosis Date Noted  . Vitamin D deficiency 08/25/2017  . Anemia during pregnancy in second trimester 08/25/2017  . Supervision of normal first pregnancy, antepartum 08/21/2017  . History of heart murmur in childhood 08/21/2017  . Scoliosis 08/21/2017  . Family history of identical twins 08/21/2017  . Late prenatal care 08/21/2017    Past Surgical History:    Procedure Laterality Date  . CYST REMOVAL HAND    . NO PAST SURGERIES       OB History    Gravida  1   Para  0   Term  0   Preterm  0   AB  0   Living  0     SAB  0   TAB  0   Ectopic  0   Multiple  0   Live Births               Home Medications    Prior to Admission medications   Medication Sig Start Date End Date Taking? Authorizing Provider  Prenatal-DSS-FeCb-FeGl-FA (CITRANATAL BLOOM) 90-1 MG TABS Take 1 tablet by mouth daily. 08/25/17  Yes Denney, Rachelle A, CNM  terconazole (TERAZOL 3) 0.8 % vaginal cream Place 1 applicator vaginally at bedtime. 08/25/17  Yes Denney, Rachelle A, CNM  Vitamin D, Ergocalciferol, (DRISDOL) 50000 units CAPS capsule Take 1 capsule (50,000 Units total) by mouth every 7 (seven) days. 08/25/17  Yes Denney, Rachelle A, CNM  metoCLOPramide (REGLAN) 10 MG tablet Take 1 tablet (10 mg total) by mouth every 6 (six) hours as needed for nausea (or headache). Patient not taking: Reported on 08/21/2017 07/14/17   Dione Booze, MD  Prenatal Vit-Fe Fumarate-FA (PRENATAL COMPLETE) 14-0.4 MG TABS Take 1 tablet by mouth daily.  Patient not taking: Reported on 09/11/2017 07/14/17   Dione Booze, MD    Family History Family History  Problem Relation Age of Onset  . Hypertension Maternal Grandmother   . Heart attack Maternal Grandmother   . Hypertension Mother     Social History Social History   Tobacco Use  . Smoking status: Never Smoker  . Smokeless tobacco: Never Used  Substance Use Topics  . Alcohol use: No  . Drug use: No     Allergies   Patient has no known allergies.   Review of Systems Review of Systems Ten systems reviewed and are negative for acute change, except as noted in the HPI.   Physical Exam Updated Vital Signs BP 114/68 (BP Location: Right Arm)   Pulse 95   Temp 98.1 F (36.7 C) (Oral)   Resp (!) 23   LMP 05/01/2017   SpO2 100%   Physical Exam  Constitutional: She is oriented to person, place, and time. She  appears well-developed and well-nourished. No distress.  HENT:  Head: Normocephalic and atraumatic.  Eyes: Pupils are equal, round, and reactive to light. Conjunctivae and EOM are normal. No scleral icterus.  Neck: Normal range of motion.  Cardiovascular: Regular rhythm.  Tachycardic, gallop versus physiologic split  Pulmonary/Chest: Effort normal and breath sounds normal. No respiratory distress.  Abdominal: Soft. Bowel sounds are normal. She exhibits no distension and no mass. There is no tenderness. There is no guarding.  Musculoskeletal:       Right lower leg: Normal. She exhibits no edema.       Left lower leg: Normal. She exhibits no edema.  Neurological: She is alert and oriented to person, place, and time.  Skin: Skin is warm and dry. She is not diaphoretic.  Psychiatric: Her behavior is normal.  Nursing note and vitals reviewed.    ED Treatments / Results  Labs (all labs ordered are listed, but only abnormal results are displayed) Labs Reviewed  BASIC METABOLIC PANEL - Abnormal; Notable for the following components:      Result Value   Glucose, Bld 106 (*)    Calcium 8.6 (*)    All other components within normal limits  CBC - Abnormal; Notable for the following components:   WBC 11.5 (*)    Hemoglobin 8.8 (*)    HCT 29.9 (*)    MCV 65.9 (*)    MCH 19.4 (*)    MCHC 29.4 (*)    All other components within normal limits  HCG, QUANTITATIVE, PREGNANCY - Abnormal; Notable for the following components:   hCG, Beta Chain, Quant, S 30,847 (*)    All other components within normal limits  I-STAT TROPONIN, ED    EKG EKG Interpretation  Date/Time:  Thursday Sep 11 2017 15:03:36 EDT Ventricular Rate:  94 PR Interval:    QRS Duration: 78 QT Interval:  322 QTC Calculation: 403 R Axis:   85 Text Interpretation:  Sinus rhythm Borderline short PR interval Borderline T abnormalities, anterior leads No previous tracing Confirmed by Gwyneth Sprout ([id number]) on 09/11/2017  4:56:52 PM   Radiology Dg Chest 2 View  Result Date: 09/11/2017 CLINICAL DATA:  Chest pain and shortness of breath EXAM: CHEST - 2 VIEW COMPARISON:  None. FINDINGS: Lungs are clear. Heart size and pulmonary vascularity are normal. No adenopathy. There is lower thoracic dextroscoliosis. IMPRESSION: No edema or consolidation. Electronically Signed   By: Bretta Bang III M.D.   On: 09/11/2017 15:45   [place] Mfm Ob Comp +  14 Wk  Result Date: 09/11/2017 ----------------------------------------------------------------------  OBSTETRICS REPORT                      (Signed Final 09/11/2017 12:15 pm) ---------------------------------------------------------------------- Patient Info  ID #:       [id number]                          D.O.B.:  1990-05-25 (26 yrs)  Name:       Gabrielle Huff                  Visit Date: 09/11/2017 11:20 am ---------------------------------------------------------------------- Performed By  Performed By:     Eden Lathe BS      Ref. Address:     [address]                    [facility]                                                             [address]                                                             [address]                                                             [place]                                                             [id number]  Attending:        Erle Crocker MD     Location:         [facility]  Referred By:      Center for                    [facility]                    Healthcare - [facility] ---------------------------------------------------------------------- Orders   #  Description                                 Code   1  [place] MFM OB COMP + 14 WK                      76805.01  ----------------------------------------------------------------------   #  Ordered By               Order #        Accession #    Episode #   1  RACHELLE DENNEY          [id number]      [id number]     [id number]   ---------------------------------------------------------------------- Indications   [redacted] weeks gestation of pregnancy                Z3A.19   Encounter for antenatal screening for          Z36.3   malformations  ---------------------------------------------------------------------- OB History  Gravidity:    1         Term:   0        Prem:   0        SAB:   0  TOP:          0       Ectopic:  0        Living: 0 ---------------------------------------------------------------------- Fetal Evaluation  Num Of Fetuses:     1  Fetal Heart         150  Rate(bpm):  Cardiac Activity:   Observed  Presentation:       Cephalic  Placenta:           Anterior, above cervical os  P. Cord Insertion:  Visualized  Amniotic Fluid  AFI FV:      Subjectively within normal limits                              Largest Pocket(cm)                              5.22 ---------------------------------------------------------------------- Biometry  BPD:      43.7  mm     G. Age:  19w 1d         60  %    CI:         71.1   %    70 - 86                                                          FL/HC:      17.9   %    16.1 - 18.3  HC:      165.1  mm     G. Age:  19w 1d         54  %    HC/AC:      1.22        1.09 - 1.39  AC:      135.2  mm     G. Age:  19w 0d         45  %    FL/BPD:     67.7   %  FL:       29.6  mm     G. Age:  19w 1d         48  %    FL/AC:      21.9   %    20 - 24  HUM:      29.5  mm     G. Age:  19w 5d         67  %  CER:        20  mm     G. Age:  19w 0d         50  %  NFT:       4.9  mm  CM:        4.2  mm  Est. FW:     272  gm    0 lb 10 oz      46  % ---------------------------------------------------------------------- Gestational Age  LMP:           19w 0d        Date:  05/01/17                 EDD:   02/05/18  U/S Today:     19w 1d                                        EDD:   02/04/18  Best:          19w 0d     Det. By:  LMP  (05/01/17)          EDD:   02/05/18  ---------------------------------------------------------------------- Anatomy  Cranium:               Appears normal         LVOT:                   Appears normal  Cavum:                 Appears normal         Aortic Arch:            Appears normal  Ventricles:            Appears normal         Ductal Arch:            Appears normal  Choroid Plexus:        Appears normal         Diaphragm:              Appears normal  Cerebellum:            Appears normal         Stomach:                Appears normal, left                                                                        sided  Posterior Fossa:       Appears normal         Abdomen:                Appears normal  Nuchal Fold:           Appears normal         Abdominal Wall:         Appears nml (cord  insert, abd wall)  Face:                  Appears normal         Cord Vessels:           Appears normal (3                         (orbits and profile)                           vessel cord)  Lips:                  Appears normal         Kidneys:                Appear normal  Palate:                Appears normal         Bladder:                Appears normal  Thoracic:              Appears normal         Spine:                  Appears normal  Heart:                 Appears normal         Upper Extremities:      Appears normal                         (4CH, axis, and                         situs)  RVOT:                  Appears normal         Lower Extremities:      Appears normal  Other:  Fetus appears to be a female. Heels and 5th digit visualized. Nasal          bone visualized. Open hands visualized. ---------------------------------------------------------------------- Cervix Uterus Adnexa  Cervix  Length:            3.6  cm.  Normal appearance by transabdominal scan.  Uterus  No abnormality visualized.  Left Ovary  Within normal limits.  Right Ovary  Within normal limits.  Cul De Sac:   No  free fluid seen.  Adnexa:       No abnormality visualized. ---------------------------------------------------------------------- Impression  Indication: 27 yr old G1P0 at [redacted]w[redacted]d for fetal anatomic survey.  Remote read.  Findings:  1. Single intrauterine pregnancy with normal cardiac activity.  2. Fetal biometry is consistent with dating.  3. Anterior placenta without evidence of previa.  4. Normal amniotic fluid volume.  5. Normal transabdominal cervical length.  6. No adnexal masses seen.  7. Normal fetal anatomic survey. ---------------------------------------------------------------------- Recommendations  1. Appropriate fetal growth.  2. Normal fetal anatomic survey.  3. Low risk cell free fetal DNA and MSAFP  4. Recommend follow up ultrasounds as clinically indicated ----------------------------------------------------------------------                Erle Crocker, MD Electronically Signed Final Report   09/11/2017 12:15 pm ----------------------------------------------------------------------   Procedures Procedures (including  critical care time)  Medications Ordered in ED Medications  sodium chloride 0.9 % bolus 1,000 mL (has no administration in time range)     Initial Impression / Assessment and Plan / ED Course  I have reviewed the triage vital signs and the nursing notes.  Pertinent labs & imaging results that were available during my care of the patient were reviewed by me and considered in my medical decision making (see chart for details).  Clinical Course as of May 24 0001  Thu Sep 11, 2017  1628 Patient here with exertional dyspnea.  She does not have pleuritic chest pain.  Her symptoms are improved when lying flat and her pain is improved when she takes a deep breath.  Differential includes cardiomyopathy, hypovolemia, POTS, arrhythmia, infection.  Less likely and I have very low suspicion for pulmonary embolus.  I discussed the case with Dr. Anitra Lauth.  Patient denies vaginal  symptoms such as bleeding, fluid from her vagina or urinary symptoms such as burning, frequency or urgency.   [AH]  1647 Patient EKG reviewed and shows short PR, borderline T wave abnormalities,  NSR, no other abnormalities.   [AH]  1731 Orthostatic VS negative.    [AH]  1610 Patient desaturated to 88% on room air ambulating about 20 feet she became labored and tachycardic with chest pain.   [AH]    Clinical Course User Index [AH] Arthor Captain, PA-C    Patient will be admitted to the hospitalist service.  I spoke with the cards fellow and he will have cardiology consult the patient in the hospital.  She is stable throughout her visit.  Final Clinical Impressions(s) / ED Diagnoses   Final diagnoses:  Chest pain, unspecified type  Tachycardia  SOB (shortness of breath)  Hypoxia    ED Discharge Orders    None       Arthor Captain, PA-C 09/12/17 0002    Gwyneth Sprout, MD 09/15/17 [phone]

## 2017-09-11 NOTE — ED Notes (Signed)
Pt ambulated to the bathroom independently.

## 2017-09-11 NOTE — ED Triage Notes (Signed)
Pt reports that she has been having chest pains on left side for past month but everyday the pains last longer and longer. Reports that she is 5 months pregnant

## 2017-09-11 NOTE — Telephone Encounter (Signed)
Patient called c/o chest pains, dizziness and shortness of breath. Advised her to call 911 so she can be taken to the Hospital forthwith.

## 2017-09-11 NOTE — Progress Notes (Addendum)
Spoke with Arthor Captain, PA-C at the [facility] ER regarding Ms Galluzzo. The patient is 5 months pregnant with tachypalpitations and atypical chest pain. EKG is benign and troponin is 0.00. No clinical signs of CHF. With any movement the patient's HR increases to the 120 bpm range (sinus tach) per report. EKG, CXR, and labs are reviewed in Epic. I have recommended a 24 hour Holter monitor, 2D echocardiogram, and outpatient cardiology visit after her studies are completed. Will arrange and office will contact patient.   Tonny Bollman MD 09/11/2017 5:58 PM  Addendum: Paged by Arthor Captain, PA-C at [facility] ER to f/u Ms. Cordial.  Pt continues to have persistent SOB and desaturations with ambulation so team is planning on admitting to Medicine vs. OB/Gyn.  Wanted to let us know to see if we could arrange her echo as an outpatient.  I will alert team tomorrow to see her as a consult once she is admitted.  Azzie Glatter, MD 09/11/17 9:24 PM

## 2017-09-11 NOTE — ED Notes (Signed)
Pt walked 20 feet. SpO2 dropped to 88%

## 2017-09-12 ENCOUNTER — Other Ambulatory Visit: Payer: Self-pay | Admitting: Certified Nurse Midwife

## 2017-09-12 ENCOUNTER — Other Ambulatory Visit (HOSPITAL_COMMUNITY): Payer: PRIVATE HEALTH INSURANCE

## 2017-09-12 ENCOUNTER — Other Ambulatory Visit: Payer: Self-pay

## 2017-09-12 ENCOUNTER — Observation Stay (HOSPITAL_BASED_OUTPATIENT_CLINIC_OR_DEPARTMENT_OTHER): Payer: Medicaid Other

## 2017-09-12 ENCOUNTER — Other Ambulatory Visit: Payer: Self-pay | Admitting: Cardiology

## 2017-09-12 DIAGNOSIS — R0602 Shortness of breath: Secondary | ICD-10-CM

## 2017-09-12 DIAGNOSIS — R002 Palpitations: Secondary | ICD-10-CM

## 2017-09-12 DIAGNOSIS — R000 Tachycardia, unspecified: Secondary | ICD-10-CM | POA: Diagnosis not present

## 2017-09-12 DIAGNOSIS — Z3A20 20 weeks gestation of pregnancy: Secondary | ICD-10-CM

## 2017-09-12 DIAGNOSIS — R079 Chest pain, unspecified: Secondary | ICD-10-CM

## 2017-09-12 DIAGNOSIS — D649 Anemia, unspecified: Secondary | ICD-10-CM

## 2017-09-12 DIAGNOSIS — O26892 Other specified pregnancy related conditions, second trimester: Secondary | ICD-10-CM | POA: Diagnosis not present

## 2017-09-12 DIAGNOSIS — D72829 Elevated white blood cell count, unspecified: Secondary | ICD-10-CM | POA: Diagnosis not present

## 2017-09-12 DIAGNOSIS — O99012 Anemia complicating pregnancy, second trimester: Secondary | ICD-10-CM | POA: Diagnosis not present

## 2017-09-12 DIAGNOSIS — Z3400 Encounter for supervision of normal first pregnancy, unspecified trimester: Secondary | ICD-10-CM

## 2017-09-12 LAB — COMPREHENSIVE METABOLIC PANEL
ALBUMIN: 2.8 g/dL — AB (ref 3.5–5.0)
ALT: 8 U/L — ABNORMAL LOW (ref 14–54)
ANION GAP: 8 (ref 5–15)
AST: 15 U/L (ref 15–41)
Alkaline Phosphatase: 44 U/L (ref 38–126)
BILIRUBIN TOTAL: 0.3 mg/dL (ref 0.3–1.2)
CHLORIDE: 107 mmol/L (ref 101–111)
CO2: 21 mmol/L — ABNORMAL LOW (ref 22–32)
Calcium: 8.4 mg/dL — ABNORMAL LOW (ref 8.9–10.3)
Creatinine, Ser: 0.47 mg/dL (ref 0.44–1.00)
GFR calc Af Amer: >60 mL/min (ref >60)
GFR calc non Af Amer: >60 mL/min (ref >60)
GLUCOSE: 82 mg/dL (ref 65–99)
POTASSIUM: 3.5 mmol/L (ref 3.5–5.1)
Sodium: 136 mmol/L (ref 135–145)
TOTAL PROTEIN: 5.9 g/dL — AB (ref 6.5–8.1)

## 2017-09-12 LAB — CBC WITH DIFFERENTIAL/PLATELET
BASOS ABS: 0 10*3/uL (ref 0.0–0.1)
Basophils Relative: 0 %
EOS ABS: 0.1 10*3/uL (ref 0.0–0.7)
EOS PCT: 1 %
HEMATOCRIT: 26.5 % — AB (ref 36.0–46.0)
Hemoglobin: 7.8 g/dL — ABNORMAL LOW (ref 12.0–15.0)
Lymphocytes Relative: 16 %
Lymphs Abs: 1.6 10*3/uL (ref 0.7–4.0)
MCH: 19.5 pg — ABNORMAL LOW (ref 26.0–34.0)
MCHC: 29.4 g/dL — AB (ref 30.0–36.0)
MCV: 66.1 fL — ABNORMAL LOW (ref 78.0–100.0)
MONO ABS: 0.9 10*3/uL (ref 0.1–1.0)
Monocytes Relative: 9 %
Neutro Abs: 7.4 10*3/uL (ref 1.7–7.7)
Neutrophils Relative %: 74 %
PLATELETS: 273 10*3/uL (ref 150–400)
RBC: 4.01 MIL/uL (ref 3.87–5.11)
RDW: 15.5 % (ref 11.5–15.5)
WBC: 10 10*3/uL (ref 4.0–10.5)

## 2017-09-12 LAB — TROPONIN I
Troponin I: <0.03 ng/mL (ref <0.03)
Troponin I: <0.03 ng/mL (ref <0.03)

## 2017-09-12 LAB — HEMOGLOBIN AND HEMATOCRIT, BLOOD
HEMATOCRIT: 26.1 % — AB (ref 36.0–46.0)
Hemoglobin: 8 g/dL — ABNORMAL LOW (ref 12.0–15.0)

## 2017-09-12 LAB — PREPARE RBC (CROSSMATCH)

## 2017-09-12 LAB — MAGNESIUM: Magnesium: 1.7 mg/dL (ref 1.7–2.4)

## 2017-09-12 LAB — IRON AND TIBC
IRON: 20 ug/dL — AB (ref 28–170)
Saturation Ratios: 4 % — ABNORMAL LOW (ref 10.4–31.8)
TIBC: 503 ug/dL — ABNORMAL HIGH (ref 250–450)
UIBC: 483 ug/dL

## 2017-09-12 LAB — RAPID URINE DRUG SCREEN, HOSP PERFORMED
Amphetamines: NOT DETECTED
BARBITURATES: NOT DETECTED
Benzodiazepines: NOT DETECTED
COCAINE: NOT DETECTED
OPIATES: NOT DETECTED
Tetrahydrocannabinol: NOT DETECTED

## 2017-09-12 LAB — RETICULOCYTES
RBC.: 4 MIL/uL (ref 3.87–5.11)
RETIC CT PCT: 1.6 % (ref 0.4–3.1)
Retic Count, Absolute: 64 10*3/uL (ref 19.0–186.0)

## 2017-09-12 LAB — ECHOCARDIOGRAM COMPLETE: Height: 63 in

## 2017-09-12 LAB — ABO/RH: ABO/RH(D): O POS

## 2017-09-12 LAB — PHOSPHORUS: Phosphorus: 3.2 mg/dL (ref 2.5–4.6)

## 2017-09-12 MED ORDER — ACETAMINOPHEN 325 MG PO TABS: 650.0000 mg | ORAL_TABLET | Freq: Four times a day (QID) | ORAL | Status: DC | PRN

## 2017-09-12 MED ORDER — ONDANSETRON HCL 4 MG/2ML IJ SOLN
4.0000 mg | Freq: Four times a day (QID) | INTRAMUSCULAR | Status: DC | PRN
  Administered 2017-09-12: 4 mg via INTRAVENOUS
  Filled 2017-09-12: qty 2

## 2017-09-12 MED ORDER — ONDANSETRON HCL 4 MG PO TABS: 4.0000 mg | ORAL_TABLET | Freq: Four times a day (QID) | ORAL | Status: DC | PRN

## 2017-09-12 MED ORDER — FERROUS SULFATE 325 (65 FE) MG PO TABS
325.0000 mg | ORAL_TABLET | Freq: Every day | ORAL | Status: DC
  Administered 2017-09-12 – 2017-09-13 (×2): 325 mg via ORAL
  Filled 2017-09-12 (×2): qty 1

## 2017-09-12 MED ORDER — ACETAMINOPHEN 650 MG RE SUPP: 650.0000 mg | Freq: Four times a day (QID) | RECTAL | Status: DC | PRN

## 2017-09-12 MED ORDER — FUROSEMIDE 10 MG/ML IJ SOLN
20.0000 mg | Freq: Once | INTRAMUSCULAR | Status: DC
  Filled 2017-09-12: qty 2

## 2017-09-12 MED ORDER — SODIUM CHLORIDE 0.9 % IV SOLN
Freq: Once | INTRAVENOUS | Status: AC
  Administered 2017-09-12: 11:00:00 via INTRAVENOUS

## 2017-09-12 MED ORDER — HEPARIN SODIUM (PORCINE) 5000 UNIT/ML IJ SOLN: 5000.0000 [IU] | Freq: Three times a day (TID) | INTRAMUSCULAR | Status: DC

## 2017-09-12 MED ORDER — PRENATAL MULTIVITAMIN CH
1.0000 | ORAL_TABLET | Freq: Every day | ORAL | Status: DC
  Administered 2017-09-12: 1 via ORAL
  Filled 2017-09-12 (×2): qty 1

## 2017-09-12 MED ORDER — SODIUM CHLORIDE 0.9 % IV SOLN
INTRAVENOUS | Status: DC
  Administered 2017-09-12 – 2017-09-13 (×3): via INTRAVENOUS

## 2017-09-12 NOTE — Progress Notes (Signed)
Called ED charge nurse to check fetal heart tone.

## 2017-09-12 NOTE — Progress Notes (Signed)
Per ED RN Njihi baby with heart rate of 142. Strong fetal tones. Sitting in the left lower.

## 2017-09-12 NOTE — Plan of Care (Signed)

## 2017-09-12 NOTE — Progress Notes (Signed)
The patient was admitted early this morning after midnight and H&P has been reviewed and I am in current agreement with the assessment and plan done by Dr. Kennis Carina.  Additional changes to the plan of care been made accordingly.  The patient is a 27 year old African-American female with a past medical history of anemia vitamin D deficiency who is [redacted] weeks pregnant who presented to [facility] with a chief complaint of exertional chest pain or shortness of breath associated with palpitations over a month duration.  Symptoms are worsened when she is standing and she denies any GI or GU blood losses and presented to her gynecologist office who then sent subsequently sent her to the ED for evaluation.  She is admitted for symptomatic anemia as well as acute chest pain.  Cardiology was consulted yesterday and are evaluating patient and recommending continue current plan of care and obtaining echocardiogram..  Patient is being transfused 1 unit of PRBCs.  Anemia panel showed the patient's iron was severely low.  She was started on iron supplementation 3 to 5 mg of ferrous sulfate in addition to her prenatal vitamins.  We will obtain a OB/GYN nurse to evaluate patient's fetal heart tones every shift.  Troponins have been negative x3 and appreciate cardiology evaluation her condition.  We will continue to monitor patient carefully and monitor her clinical response to intervention and repeat labs in the a.m.

## 2017-09-12 NOTE — ED Notes (Signed)
ED TO INPATIENT HANDOFF REPORT  Name/Age/Gender Gabrielle Huff 27 y.o. female  Code Status   Home/SNF/Other Home  Chief Complaint chest pain / [facility] / pregnant   Level of Care/Admitting Diagnosis ED Disposition    ED Disposition Condition [facility] Area: [place] [[id number]]  Level of Care: Telemetry [5]  Admit to tele based on following criteria: Other see comments  Comments: chest pain  Diagnosis: Chest pain [220254]  Admitting Physician: Bethena Roys [[id number]]  Attending Physician: Bethena Roys [phone]  PT Class (Do Not Modify): Observation [104]  PT Acc Code (Do Not Modify): Observation [10022]       Medical History Past Medical History:  Diagnosis Date  . Anemia   . Dysmenorrhea   . Heart murmur   . HSV-1 infection     Allergies No Known Allergies  IV Location/Drains/Wounds Patient Lines/Drains/Airways Status   Active Line/Drains/Airways    Name:   Placement date:   Placement time:   Site:   Days:   Peripheral IV 09/11/17 Right Antecubital   09/11/17    1948    Antecubital   1          Labs/Imaging Results for orders placed or performed during the hospital encounter of 09/11/17 (from the past 48 hour(s))  Basic metabolic panel     Status: Abnormal   Collection Time: 09/11/17  3:18 PM  Result Value Ref Range   Sodium 135 135 - 145 mmol/L   Potassium 3.9 3.5 - 5.1 mmol/L   Chloride 105 101 - 111 mmol/L   CO2 22 22 - 32 mmol/L   Glucose, Bld 106 (H) 65 - 99 mg/dL   BUN 6 6 - 20 mg/dL   Creatinine, Ser 0.57 0.44 - 1.00 mg/dL   Calcium 8.6 (L) 8.9 - 10.3 mg/dL   GFR calc non Af Amer >60 >60 mL/min   GFR calc Af Amer >60 >60 mL/min    Comment: (NOTE) The eGFR has been calculated using the CKD EPI equation. This calculation has not been validated in all clinical situations. eGFR's persistently <60 mL/min signify possible Chronic Kidney Disease.    Anion gap 8 5 - 15    Comment: Performed at  [facility], [place] [address], [place], [place] [address]  CBC     Status: Abnormal   Collection Time: 09/11/17  3:18 PM  Result Value Ref Range   WBC 11.5 (H) 4.0 - 10.5 K/uL   RBC 4.54 3.87 - 5.11 MIL/uL   Hemoglobin 8.8 (L) 12.0 - 15.0 g/dL   HCT 29.9 (L) 36.0 - 46.0 %   MCV 65.9 (L) 78.0 - 100.0 fL   MCH 19.4 (L) 26.0 - 34.0 pg   MCHC 29.4 (L) 30.0 - 36.0 g/dL   RDW 15.4 11.5 - 15.5 %   Platelets 285 150 - 400 K/uL    Comment: Performed at [facility], [place] [address], [place], [place] [address]  hCG, quantitative, pregnancy     Status: Abnormal   Collection Time: 09/11/17  3:18 PM  Result Value Ref Range   hCG, Beta Chain, Quant, S 30,847 (H) <5 mIU/mL    Comment:          GEST. AGE      CONC.  (mIU/mL)   <=1 WEEK        5 - 50     2 WEEKS       50 - 500  3 WEEKS       100 - 10,000     4 WEEKS     1,000 - 30,000     5 WEEKS     3,500 - 115,000   6-8 WEEKS     12,000 - 270,000    12 WEEKS     15,000 - 220,000        FEMALE AND NON-PREGNANT FEMALE:     LESS THAN 5 mIU/mL Performed at [facility], [place] [address], [place], [place] [address]   I-stat troponin, ED     Status: None   Collection Time: 09/11/17  3:26 PM  Result Value Ref Range   Troponin i, poc 0.00 0.00 - 0.08 ng/mL   Comment 3            Comment: Due to the release kinetics of cTnI, a negative result within the first hours of the onset of symptoms does not rule out myocardial infarction with certainty. If myocardial infarction is still suspected, repeat the test at appropriate intervals.    Dg Chest 2 View  Result Date: 09/11/2017 CLINICAL DATA:  Chest pain and shortness of breath EXAM: CHEST - 2 VIEW COMPARISON:  None. FINDINGS: Lungs are clear. Heart size and pulmonary vascularity are normal. No adenopathy. There is lower thoracic dextroscoliosis. IMPRESSION: No edema or consolidation. Electronically Signed   By: Lowella Grip III M.D.   On:  09/11/2017 15:45   Ct Angio Chest Pe W And/or Wo Contrast  Result Date: 09/11/2017 CLINICAL DATA:  27 year old pregnant female with chest pain and shortness of breath for 1 month. EXAM: CT ANGIOGRAPHY CHEST WITH CONTRAST TECHNIQUE: Multidetector CT imaging of the chest was performed using the standard protocol during bolus administration of intravenous contrast. Multiplanar CT image reconstructions and MIPs were obtained to evaluate the vascular anatomy. CONTRAST:  80 cc intravenous Isovue 370 COMPARISON:  09/11/2017 chest radiograph FINDINGS: Cardiovascular: This is a technically adequate study although respiratory motion artifact decreases sensitivity in some portions of the lungs. No pulmonary emboli are identified. Heart size normal. No thoracic aortic aneurysm or pericardial effusion. Mediastinum/Nodes: No enlarged mediastinal, hilar, or axillary lymph nodes. Thyroid gland, trachea, and esophagus demonstrate no significant findings. Lungs/Pleura: Lungs are clear. No pleural effusion or pneumothorax. Upper Abdomen: No acute abnormality Musculoskeletal: A moderate apex RIGHT thoracic scoliosis is noted. No acute or suspicious bony abnormalities are identified. Review of the MIP images confirms the above findings. IMPRESSION: 1. No evidence of acute abnormality.  No pulmonary emboli. 2. Moderate apex RIGHT thoracic scoliosis. Electronically Signed   By: Margarette Canada M.D.   On: 09/11/2017 20:44   [place] Mfm Ob Comp + 14 Wk  Result Date: 09/11/2017 ----------------------------------------------------------------------  OBSTETRICS REPORT                      (Signed Final 09/11/2017 12:15 pm) ---------------------------------------------------------------------- Patient Info  ID #:       [id number]                          D.O.B.:  January 13, 1991 (26 yrs)  Name:       Gabrielle Huff                  Visit Date: 09/11/2017 11:20 am ---------------------------------------------------------------------- Performed By   Performed By:     Rodrigo Ran BS      Ref. Address:     [address]  [address]                                                             [address]                                                             [place]                                                             [place]  Attending:        Wende Mott MD     Location:         [facility]  Referred By:      [facility]                    [place] ---------------------------------------------------------------------- Orders   #  Description                                 Code   1  [place] MFM OB COMP + 14 WK                      62703.50  ----------------------------------------------------------------------   #  Ordered By               Order #        Accession #    Episode #   1  RACHELLE Margurite Auerbach          [id number]      [id number]     [id number]  ---------------------------------------------------------------------- Indications   [redacted] weeks gestation of pregnancy                Z3A.19   Encounter for antenatal screening for          Z36.3   malformations  ---------------------------------------------------------------------- OB History  Gravidity:    1         Term:   0        Prem:   0        SAB:   0  TOP:          0       Ectopic:  0        Living: 0 ---------------------------------------------------------------------- Fetal Evaluation  Num Of Fetuses:     1  Fetal  Heart         150  Rate(bpm):  Cardiac Activity:   Observed  Presentation:       Cephalic  Placenta:           Anterior, above cervical os  P. Cord Insertion:  Visualized  Amniotic Fluid  AFI FV:      Subjectively within normal limits                              Largest Pocket(cm)                              5.22 ---------------------------------------------------------------------- Biometry  BPD:      43.7  mm     G. Age:  19w 1d         60  %    CI:          71.1   %    70 - 86                                                          FL/HC:      17.9   %    16.1 - 18.3  HC:      165.1  mm     G. Age:  19w 1d         62  %    HC/AC:      1.22        1.09 - 1.39  AC:      135.2  mm     G. Age:  19w 0d         45  %    FL/BPD:     67.7   %  FL:       29.6  mm     G. Age:  19w 1d         48  %    FL/AC:      21.9   %    20 - 24  HUM:      29.5  mm     G. Age:  19w 5d         67  %  CER:        20  mm     G. Age:  19w 0d         50  %  NFT:       4.9  mm  CM:        4.2  mm  Est. FW:     272  gm    0 lb 10 oz      46  % ---------------------------------------------------------------------- Gestational Age  LMP:           19w 0d        Date:  05/01/17                 EDD:   02/05/18  U/S Today:     19w 1d  EDD:   02/04/18  Best:          19w 0d     Det. By:  LMP  (05/01/17)          EDD:   02/05/18 ---------------------------------------------------------------------- Anatomy  Cranium:               Appears normal         LVOT:                   Appears normal  Cavum:                 Appears normal         Aortic Arch:            Appears normal  Ventricles:            Appears normal         Ductal Arch:            Appears normal  Choroid Plexus:        Appears normal         Diaphragm:              Appears normal  Cerebellum:            Appears normal         Stomach:                Appears normal, left                                                                        sided  Posterior Fossa:       Appears normal         Abdomen:                Appears normal  Nuchal Fold:           Appears normal         Abdominal Wall:         Appears nml (cord                                                                        insert, abd wall)  Face:                  Appears normal         Cord Vessels:           Appears normal (3                         (orbits and profile)                           vessel cord)  Lips:                  Appears  normal         Kidneys:  Appear normal  Palate:                Appears normal         Bladder:                Appears normal  Thoracic:              Appears normal         Spine:                  Appears normal  Heart:                 Appears normal         Upper Extremities:      Appears normal                         (4CH, axis, and                         situs)  RVOT:                  Appears normal         Lower Extremities:      Appears normal  Other:  Fetus appears to be a female. Heels and 5th digit visualized. Nasal          bone visualized. Open hands visualized. ---------------------------------------------------------------------- Cervix Uterus Adnexa  Cervix  Length:            3.6  cm.  Normal appearance by transabdominal scan.  Uterus  No abnormality visualized.  Left Ovary  Within normal limits.  Right Ovary  Within normal limits.  Cul De Sac:   No free fluid seen.  Adnexa:       No abnormality visualized. ---------------------------------------------------------------------- Impression  Indication: 27 yr old G1P0 at 46w0dfor fetal anatomic survey.  Remote read.  Findings:  1. Single intrauterine pregnancy with normal cardiac activity.  2. Fetal biometry is consistent with dating.  3. Anterior placenta without evidence of previa.  4. Normal amniotic fluid volume.  5. Normal transabdominal cervical length.  6. No adnexal masses seen.  7. Normal fetal anatomic survey. ---------------------------------------------------------------------- Recommendations  1. Appropriate fetal growth.  2. Normal fetal anatomic survey.  3. Low risk cell free fetal DNA and MSAFP  4. Recommend follow up ultrasounds as clinically indicated ----------------------------------------------------------------------                KWende Mott MD Electronically Signed Final Report   09/11/2017 12:15 pm ----------------------------------------------------------------------   Pending Labs Unresulted Labs (From  admission, onward)   Start     Ordered   09/11/17 2359  Troponin I (q 6hr x 3)  Now then every 6 hours,   R     09/11/17 2358   Signed and Held  HIV antibody (Routine Testing)  Once,   R     Signed and Held      Vitals/Pain Today's Vitals   09/11/17 2100 09/11/17 2130 09/12/17 0004 09/12/17 0006  BP: 113/64 106/80 118/75 118/75  Pulse:   75 82  Resp: 19 14 (!) 22 19  Temp:      TempSrc:      SpO2: 100%  97% (!) 88%  Height:      PainSc:        Isolation Precautions No active isolations  Medications Medications  iopamidol (ISOVUE-370) 76 % injection (has no  administration in time range)  sodium chloride 0.9 % bolus 1,000 mL (0 mLs Intravenous Stopped 09/11/17 1910)  iopamidol (ISOVUE-370) 76 % injection 100 mL (80 mLs Intravenous Contrast Given 09/11/17 2020)    Mobility walks

## 2017-09-12 NOTE — Progress Notes (Signed)
Paged Dr. Mariea Clonts to D/C orders to check fetal heart tones as staff is unable to do so on unit.

## 2017-09-12 NOTE — H&P (Addendum)
History and Physical    Gabrielle Huff ZOX:[id number] DOB: [birth year] DOA: 09/11/2017  PCP: Patient, No Pcp Per   Patient coming from: HOME  Chief Complaint: Chest pain, SOB  HPI: Gabrielle Huff is a 27 y.o. female with no medical history significant, who is [redacted] weeks pregnant-1st pregnancy.  Noted to the ED with complaints of exertional chest pain with shortness of breath, feeling like she will pass out and palpitations for about 1 month duration.  Symptoms are not relieved by rest. chest pain is lower sternal region and left sided.  Chest pain is described as a tightness.  Symptoms have progressively worsened over the past month.  Patient reports now becomes short of breath with minimal activity like taking a few steps.  Symptoms are only present when standing.  Patient denies personal or family history of premature coronary artery disease or blood clots in lungs or legs.  And is otherwise healthy.  Never smoker.  Denies illicit drug use.  Only medication she takes her prenatal vitamins.  She denies asthma or any other medical conditions. Patient endorses good p.o. Intake, denies vomiting or loose stools.  Denies genitourinary blood loss during pregnancy. She presented to her gynecologist office and was subsequently sent to the ED.   ED Course: Stable vitals with O2 sats dropping to 88% with ambulation.  Negative orthostatic vital.  Patient was noted to become tachycardic to 120s with minimal activity. Hemoglobin- low 8.8, BMP unremarkable.  EKG shows Q waves inferior lateral leads, ST or T wave abnormalities. Troponin negative.  CTA chest-negative for pulmonary embolism.  Cardiology was consulted in the ED with recommendations for Holter monitor, 2D echo, and outpatient cardiology follow-up.,  but with SOB, and desaturation, hospitalist was consulted for admission, cardiology will alert team to see patient in consult.  Review of Systems: As per HPI otherwise 10 point review of systems negative.    Past Medical History:  Diagnosis Date  . Anemia   . Dysmenorrhea   . Heart murmur   . HSV-1 infection     Past Surgical History:  Procedure Laterality Date  . CYST REMOVAL HAND    . NO PAST SURGERIES      reports that she has never smoked. She has never used smokeless tobacco. She reports that she does not drink alcohol or use drugs.  No Known Allergies  Family History  Problem Relation Age of Onset  . Hypertension Maternal Grandmother   . Heart attack Maternal Grandmother   . Hypertension Mother     Prior to Admission medications   Medication Sig Start Date End Date Taking? Authorizing Provider  Prenatal-DSS-FeCb-FeGl-FA (CITRANATAL BLOOM) 90-1 MG TABS Take 1 tablet by mouth daily. 08/25/17  Yes Denney, Rachelle A, CNM  terconazole (TERAZOL 3) 0.8 % vaginal cream Place 1 applicator vaginally at bedtime. 08/25/17  Yes Denney, Rachelle A, CNM  Vitamin D, Ergocalciferol, (DRISDOL) 50000 units CAPS capsule Take 1 capsule (50,000 Units total) by mouth every 7 (seven) days. 08/25/17  Yes Denney, Rachelle A, CNM  metoCLOPramide (REGLAN) 10 MG tablet Take 1 tablet (10 mg total) by mouth every 6 (six) hours as needed for nausea (or headache). Patient not taking: Reported on 08/21/2017 07/14/17   Dione Booze, MD  Prenatal Vit-Fe Fumarate-FA (PRENATAL COMPLETE) 14-0.4 MG TABS Take 1 tablet by mouth daily. Patient not taking: Reported on 09/11/2017 07/14/17   Dione Booze, MD    Physical Exam: Vitals:   09/11/17 2130 09/12/17 0004 09/12/17 0006 09/12/17 0030  BP: 106/80 118/75  118/75 113/64  Pulse:  75 82   Resp: 14 (!) 22 19 (!) 22  Temp:      TempSrc:      SpO2:  97% (!) 88%   Height:        Constitutional: NAD, calm, comfortable Vitals:   09/11/17 2130 09/12/17 0004 09/12/17 0006 09/12/17 0030  BP: 106/80 118/75 118/75 113/64  Pulse:  75 82   Resp: 14 (!) 22 19 (!) 22  Temp:      TempSrc:      SpO2:  97% (!) 88%   Height:       Eyes: PERRL, lids and conjunctivae  normal ENMT: Mucous membranes are moist. Posterior pharynx clear of any exudate or lesions.Normal dentition.  Neck: normal, supple, no masses, no thyromegaly Respiratory: clear to auscultation bilaterally, no wheezing, no crackles. Normal respiratory effort. No accessory muscle use.  Cardiovascular: Regular rate and rhythm, no murmurs / rubs / gallops. No extremity edema. 2+ pedal pulses. No carotid bruits.  Abdomen: no tenderness,  Minimal abdominal distension, consistent with a 20-week gravid uterus, no masses palpated. No hepatosplenomegaly. Bowel sounds positive.  Musculoskeletal: no clubbing / cyanosis. No joint deformity upper and lower extremities. Good ROM, no contractures. Normal muscle tone.  Skin: no rashes, lesions, ulcers. No induration Neurologic: CN 2-12 grossly intact. Strength 5/5 in all 4.  Psychiatric: Normal judgment and insight. Alert and oriented x 3. Normal mood.   Labs on Admission: I have personally reviewed following labs and imaging studies  CBC: Recent Labs  Lab 09/11/17 1518  WBC 11.5*  HGB 8.8*  HCT 29.9*  MCV 65.9*  PLT 285   Basic Metabolic Panel: Recent Labs  Lab 09/11/17 1518  NA 135  K 3.9  CL 105  CO2 22  GLUCOSE 106*  BUN 6  CREATININE 0.57  CALCIUM 8.6*   Cardiac Enzymes: Recent Labs  Lab 09/12/17 0009  TROPONINI <0.03   Urine analysis:    Component Value Date/Time   COLORURINE YELLOW 07/14/2017 0317   APPEARANCEUR CLEAR 07/14/2017 0317   LABSPEC 1.019 07/14/2017 0317   PHURINE 6.0 07/14/2017 0317   GLUCOSEU NEGATIVE 07/14/2017 0317   HGBUR NEGATIVE 07/14/2017 0317   BILIRUBINUR NEGATIVE 07/14/2017 0317   BILIRUBINUR n 06/20/2015 1457   KETONESUR 5 (A) 07/14/2017 0317   PROTEINUR NEGATIVE 07/14/2017 0317   UROBILINOGEN negative 06/20/2015 1457   NITRITE NEGATIVE 07/14/2017 0317   LEUKOCYTESUR NEGATIVE 07/14/2017 0317    Radiological Exams on Admission: Dg Chest 2 View  Result Date: 09/11/2017 CLINICAL DATA:  Chest  pain and shortness of breath EXAM: CHEST - 2 VIEW COMPARISON:  None. FINDINGS: Lungs are clear. Heart size and pulmonary vascularity are normal. No adenopathy. There is lower thoracic dextroscoliosis. IMPRESSION: No edema or consolidation. Electronically Signed   By: Bretta Bang III M.D.   On: 09/11/2017 15:45   Ct Angio Chest Pe W And/or Wo Contrast  Result Date: 09/11/2017 CLINICAL DATA:  27 year old pregnant female with chest pain and shortness of breath for 1 month. EXAM: CT ANGIOGRAPHY CHEST WITH CONTRAST TECHNIQUE: Multidetector CT imaging of the chest was performed using the standard protocol during bolus administration of intravenous contrast. Multiplanar CT image reconstructions and MIPs were obtained to evaluate the vascular anatomy. CONTRAST:  80 cc intravenous Isovue 370 COMPARISON:  09/11/2017 chest radiograph FINDINGS: Cardiovascular: This is a technically adequate study although respiratory motion artifact decreases sensitivity in some portions of the lungs. No pulmonary emboli are identified. Heart size normal. No thoracic  aortic aneurysm or pericardial effusion. Mediastinum/Nodes: No enlarged mediastinal, hilar, or axillary lymph nodes. Thyroid gland, trachea, and esophagus demonstrate no significant findings. Lungs/Pleura: Lungs are clear. No pleural effusion or pneumothorax. Upper Abdomen: No acute abnormality Musculoskeletal: A moderate apex RIGHT thoracic scoliosis is noted. No acute or suspicious bony abnormalities are identified. Review of the MIP images confirms the above findings. IMPRESSION: 1. No evidence of acute abnormality.  No pulmonary emboli. 2. Moderate apex RIGHT thoracic scoliosis. Electronically Signed   By: Harmon Pier M.D.   On: 09/11/2017 20:44   [place] Mfm Ob Comp + 14 Wk  Result Date: 09/11/2017 ----------------------------------------------------------------------  OBSTETRICS REPORT                      (Signed Final 09/11/2017 12:15 pm)  ---------------------------------------------------------------------- Patient Info  ID #:       [id number]                          D.O.B.:  04-04-1991 (26 yrs)  Name:       LANAYA BENNIS                  Visit Date: 09/11/2017 11:20 am ---------------------------------------------------------------------- Performed By  Performed By:     Eden Lathe BS      Ref. Address:     [address]                    [facility]T                                                             [address]                                                             [address]                                                             [place]                                                             [address]  Attending:        Erle Crocker MD     Location:         [facility]  Referred By:      [facility]                    [facility]                    [facility] ---------------------------------------------------------------------- Orders   #  Description  Code   1  [place] MFM OB COMP + 14 WK                      [id number]  ----------------------------------------------------------------------   #  Ordered By               Order #        Accession #    Episode #   1  RACHELLE Marjo Bicker          [id number]      [id number]     [id number]  ---------------------------------------------------------------------- Indications   [redacted] weeks gestation of pregnancy                Z3A.19   Encounter for antenatal screening for          Z36.3   malformations  ---------------------------------------------------------------------- OB History  Gravidity:    1         Term:   0        Prem:   0        SAB:   0  TOP:          0       Ectopic:  0        Living: 0 ---------------------------------------------------------------------- Fetal Evaluation  Num Of Fetuses:     1  Fetal Heart         150  Rate(bpm):  Cardiac Activity:   Observed  Presentation:       Cephalic  Placenta:           Anterior, above cervical os  P.  Cord Insertion:  Visualized  Amniotic Fluid  AFI FV:      Subjectively within normal limits                              Largest Pocket(cm)                              5.22 ---------------------------------------------------------------------- Biometry  BPD:      43.7  mm     G. Age:  19w 1d         60  %    CI:         71.1   %    70 - 86                                                          FL/HC:      17.9   %    16.1 - 18.3  HC:      165.1  mm     G. Age:  19w 1d         54  %    HC/AC:      1.22        1.09 - 1.39  AC:      135.2  mm     G. Age:  19w 0d         45  %    FL/BPD:     67.7   %  FL:       29.6  mm     G. Age:  19w 1d  48  %    FL/AC:      21.9   %    20 - 24  HUM:      29.5  mm     G. Age:  19w 5d         67  %  CER:        20  mm     G. Age:  19w 0d         50  %  NFT:       4.9  mm  CM:        4.2  mm  Est. FW:     272  gm    0 lb 10 oz      46  % ---------------------------------------------------------------------- Gestational Age  LMP:           19w 0d        Date:  05/01/17                 EDD:   02/05/18  U/S Today:     19w 1d                                        EDD:   02/04/18  Best:          19w 0d     Det. By:  LMP  (05/01/17)          EDD:   02/05/18 ---------------------------------------------------------------------- Anatomy  Cranium:               Appears normal         LVOT:                   Appears normal  Cavum:                 Appears normal         Aortic Arch:            Appears normal  Ventricles:            Appears normal         Ductal Arch:            Appears normal  Choroid Plexus:        Appears normal         Diaphragm:              Appears normal  Cerebellum:            Appears normal         Stomach:                Appears normal, left                                                                        sided  Posterior Fossa:       Appears normal         Abdomen:                Appears normal  Nuchal Fold:           Appears normal  Abdominal  Wall:         Appears nml (cord                                                                        insert, abd wall)  Face:                  Appears normal         Cord Vessels:           Appears normal (3                         (orbits and profile)                           vessel cord)  Lips:                  Appears normal         Kidneys:                Appear normal  Palate:                Appears normal         Bladder:                Appears normal  Thoracic:              Appears normal         Spine:                  Appears normal  Heart:                 Appears normal         Upper Extremities:      Appears normal                         (4CH, axis, and                         situs)  RVOT:                  Appears normal         Lower Extremities:      Appears normal  Other:  Fetus appears to be a female. Heels and 5th digit visualized. Nasal          bone visualized. Open hands visualized. ---------------------------------------------------------------------- Cervix Uterus Adnexa  Cervix  Length:            3.6  cm.  Normal appearance by transabdominal scan.  Uterus  No abnormality visualized.  Left Ovary  Within normal limits.  Right Ovary  Within normal limits.  Cul De Sac:   No free fluid seen.  Adnexa:       No abnormality visualized. ---------------------------------------------------------------------- Impression  Indication: 27 yr old G1P0 at [redacted]w[redacted]d for fetal anatomic survey.  Remote read.  Findings:  1. Single intrauterine pregnancy with normal cardiac activity.  2. Fetal biometry is consistent with dating.  3. Anterior placenta without evidence of previa.  4. Normal amniotic fluid volume.  5. Normal transabdominal cervical length.  6. No adnexal masses seen.  7. Normal fetal anatomic survey. ---------------------------------------------------------------------- Recommendations  1. Appropriate fetal growth.  2. Normal fetal anatomic survey.  3. Low risk cell free fetal DNA and MSAFP  4.  Recommend follow up ultrasounds as clinically indicated ----------------------------------------------------------------------                Erle Crocker, MD Electronically Signed Final Report   09/11/2017 12:15 pm ----------------------------------------------------------------------   EKG: Independently reviewed.  Q waves inferior lateral leads.  No ST or T wave abnormalities  Assessment/Plan Active Problems:   Chest pain  Chest pain-exertional with shortness of breath, palpitations, dizziness.  No significant risk factors.  Never Smoker.  Denies illicit drug use.  I-STAT troponin negative.  EKG Q waves.  CTA chest- No PE. Differentials- symptomatic anemia, POTs, valvular abnormalities but no murmur on exam,  -Echocardiogram -Trops X3 -EKG a.m -UDS  Acute symptomatic Anemia-globin 8.8.  Likely symptomatic iron defc anemia causing symptoms, aggravated by anemia of pregnancy. Hgb baseline 2015, prior to pregnancy close to 11.  Ferritin- 9, 08/21/17-indicating significant iron deficiency anemia, chronically low - Serum TIBC, Iron in a.m -Will start iron supplementation  daily, in addition to prenatal vitamins -T X M and Will transfuse 1 unit of blood  -  IV lasix X 1 with transfusion.  Gravid status-1st pregnancy. Ob US 09/11/17- 19W, normal-appearing fetus -Fetal heart tones every shift  HIV as part of routine health screening  DVT prophylaxis: heparin Code Status: Full Family Communication: Significant other at bedside Disposition Plan: per rounding team Consults called: Cardiology Admission status: obs, tele   Onnie Boer MD [facility] Pager [phone] From 6PM-2AM.  Otherwise please contact night-coverage [email or web address] Password [email or web address]  09/12/2017, 1:51 AM

## 2017-09-12 NOTE — Consult Note (Addendum)
Cardiology Consultation:   Patient ID: Gabrielle Huff; [id number]; 08-Jun-1990   Admit date: 09/11/2017 Date of Consult: 09/12/2017  Primary Care Provider: Patient, No Pcp Per Primary Cardiologist: New   Patient Profile:   Gabrielle Huff is a 27 y.o. female, [redacted] weeks pregnant, with a hx of anemia and vitamin D difciency but no other significant PMH, who is being seen today for the evaluation of chest pain and palpitations, at the request of Dr. Marland Mcalpine, Internal Medicine.  History of Present Illness:   Gabrielle Huff is [redacted] weeks pregnant. This is her first pregnancy. PMH is notable for anemia and vitamin D deficiency . She reports that she has been taking iron and Vit D supplementation at home. She denies h/o HTN, HLD, DM and no tobacco use. She notes family h/o heart disease with her maternal grandmother but no other relatives.   Over the past month, she has experience exertional CP and palpitations. Gradually worsening over the last month. Pain is substernal and is tight. Radiates underneath both breast. Also associated with dyspnea and improves with rest and laying down. No association with meals. She has felt dizzy but no syncope. She denies caffeine intake. Only drinks water and juice. Given her worsening symptoms, she came to the [facility] ED. She was noted to be hypoxic with ambulation with O2 sats dropping in the 80s. Orthostatic VSS. Gabrielle Huff also noted to have sinus tach in the ED in the 120s w/ ambulation as well. Hgb was low at 8.8, further dropping to 7.8. BMP unremarkable. CTA negative for PE. EKG showed NSR 75 bpm. She has been admitted by IM for symptomatic anemia and is getting a blood transfusion. 2D echo pending. Troponins negative x 2.    Past Medical History:  Diagnosis Date  . Anemia   . Dysmenorrhea   . Heart murmur   . HSV-1 infection     Past Surgical History:  Procedure Laterality Date  . CYST REMOVAL HAND    . NO PAST SURGERIES       Home Medications:  Prior to Admission  medications   Medication Sig Start Date End Date Taking? Authorizing Provider  Prenatal-DSS-FeCb-FeGl-FA (CITRANATAL BLOOM) 90-1 MG TABS Take 1 tablet by mouth daily. 08/25/17  Yes Denney, Rachelle A, CNM  terconazole (TERAZOL 3) 0.8 % vaginal cream Place 1 applicator vaginally at bedtime. 08/25/17  Yes Denney, Rachelle A, CNM  Vitamin D, Ergocalciferol, (DRISDOL) 50000 units CAPS capsule Take 1 capsule (50,000 Units total) by mouth every 7 (seven) days. 08/25/17  Yes Denney, Rachelle A, CNM  metoCLOPramide (REGLAN) 10 MG tablet Take 1 tablet (10 mg total) by mouth every 6 (six) hours as needed for nausea (or headache). Patient not taking: Reported on 08/21/2017 07/14/17   Dione Booze, MD  Prenatal Vit-Fe Fumarate-FA (PRENATAL COMPLETE) 14-0.4 MG TABS Take 1 tablet by mouth daily. Patient not taking: Reported on 09/11/2017 07/14/17   Dione Booze, MD    Inpatient Medications: Scheduled Meds: . ferrous sulfate  325 mg Oral Q breakfast  . furosemide  20 mg Intravenous Once  . heparin  5,000 Units Subcutaneous Q8H  . prenatal multivitamin  1 tablet Oral Daily   Continuous Infusions: . sodium chloride     PRN Meds: acetaminophen **OR** acetaminophen, ondansetron **OR** ondansetron (ZOFRAN) IV  Allergies:   No Known Allergies  Social History:   Social History   Socioeconomic History  . Marital status: Single    Spouse name: Not on file  . Number of children: Not on file  .  Years of education: Not on file  . Highest education level: Not on file  Occupational History  . Not on file  Social Needs  . Financial resource strain: Not on file  . Food insecurity:    Worry: Not on file    Inability: Not on file  . Transportation needs:    Medical: Not on file    Non-medical: Not on file  Tobacco Use  . Smoking status: Never Smoker  . Smokeless tobacco: Never Used  Substance and Sexual Activity  . Alcohol use: No  . Drug use: No  . Sexual activity: Yes    Partners: Male    Birth  control/protection: Condom  Lifestyle  . Physical activity:    Days per week: Not on file    Minutes per session: Not on file  . Stress: Not on file  Relationships  . Social connections:    Talks on phone: Not on file    Gets together: Not on file    Attends religious service: Not on file    Active member of club or organization: Not on file    Attends meetings of clubs or organizations: Not on file    Relationship status: Not on file  . Intimate partner violence:    Fear of current or ex partner: Not on file    Emotionally abused: Not on file    Physically abused: Not on file    Forced sexual activity: Not on file  Other Topics Concern  . Not on file  Social History Narrative  . Not on file    Family History:    Family History  Problem Relation Age of Onset  . Hypertension Maternal Grandmother   . Heart attack Maternal Grandmother   . Hypertension Mother      ROS:  Please see the history of present illness.   All other ROS reviewed and negative.     Physical Exam/Data:   Vitals:   09/12/17 0153 09/12/17 0243 09/12/17 0435 09/12/17 1100  BP: (!) 88/69  104/68 101/63  Pulse: 92  77 86  Resp: Temp: 98 F (36.7 C)  98.8 F (37.1 C) 99.1 F (37.3 C)  TempSrc: Oral Oral Oral Oral  SpO2: 98%  97% 100%  Height:  (1.6 m)  (1.6 m)      Intake/Output Summary (Last 24 hours) at 09/12/2017 1122 Last data filed at 09/12/2017 0931 Gross per 24 hour  Intake 1200 ml  Output -  Net 1200 ml   There were no vitals filed for this visit. Body mass index is 17.89 kg/m.  General:  Well nourished, well developed, in no acute distress HEENT: normal Lymph: no adenopathy Neck: no JVD Endocrine:  No thryomegaly Vascular: No carotid bruits; FA pulses 2+ bilaterally without bruits  Cardiac:  normal S1, S2; RRR; no murmur  Lungs:  clear to auscultation bilaterally, no wheezing, rhonchi or rales  Abd: soft, nontender, no hepatomegaly  Ext: no  edema Musculoskeletal:  No deformities, BUE and BLE strength normal and equal Skin: warm and dry  Neuro:  CNs 2-12 intact, no focal abnormalities noted Psych:  Normal affect   EKG:  The EKG was personally reviewed and demonstrates:  NSR 75 bpm Telemetry:  Telemetry was personally reviewed and demonstrates:  NSR   Relevant CV Studies: 2D echo pending   Laboratory Data:  Chemistry Recent Labs  Lab 09/11/17 1518 09/12/17 0604  NA 135 136  K 3.9 3.5  CL 105 107  CO2 22 21*  GLUCOSE 106* 82  BUN 6 <5*  CREATININE 0.57 0.47  CALCIUM 8.6* 8.4*  GFRNONAA >60 >60  GFRAA >60 >60  ANIONGAP 8 8    Recent Labs  Lab 09/12/17 0604  PROT 5.9*  ALBUMIN 2.8*  AST 15  ALT 8*  ALKPHOS 44  BILITOT 0.3   Hematology Recent Labs  Lab 09/11/17 1518 09/12/17 0604  WBC 11.5* 10.0  RBC 4.54 4.01  4.00  HGB 8.8* 7.8*  8.0*  HCT 29.9* 26.5*  26.1*  MCV 65.9* 66.1*  MCH 19.4* 19.5*  MCHC 29.4* 29.4*  RDW 15.4 15.5  PLT 285 273   Cardiac Enzymes Recent Labs  Lab 09/12/17 0009 09/12/17 0604  TROPONINI <0.03 <0.03    Recent Labs  Lab 09/11/17 1526  TROPIPOC 0.00    BNPNo results for input(s): BNP, PROBNP in the last 168 hours.  DDimer No results for input(s): DDIMER in the last 168 hours.  Radiology/Studies:  Dg Chest 2 View  Result Date: 09/11/2017 CLINICAL DATA:  Chest pain and shortness of breath EXAM: CHEST - 2 VIEW COMPARISON:  None. FINDINGS: Lungs are clear. Heart size and pulmonary vascularity are normal. No adenopathy. There is lower thoracic dextroscoliosis. IMPRESSION: No edema or consolidation. Electronically Signed   By: Bretta Bang III M.D.   On: 09/11/2017 15:45   Ct Angio Chest Pe W And/or Wo Contrast  Result Date: 09/11/2017 CLINICAL DATA:  27 year old pregnant female with chest pain and shortness of breath for 1 month. EXAM: CT ANGIOGRAPHY CHEST WITH CONTRAST TECHNIQUE: Multidetector CT imaging of the chest was performed using the standard  protocol during bolus administration of intravenous contrast. Multiplanar CT image reconstructions and MIPs were obtained to evaluate the vascular anatomy. CONTRAST:  80 cc intravenous Isovue 370 COMPARISON:  09/11/2017 chest radiograph FINDINGS: Cardiovascular: This is a technically adequate study although respiratory motion artifact decreases sensitivity in some portions of the lungs. No pulmonary emboli are identified. Heart size normal. No thoracic aortic aneurysm or pericardial effusion. Mediastinum/Nodes: No enlarged mediastinal, hilar, or axillary lymph nodes. Thyroid gland, trachea, and esophagus demonstrate no significant findings. Lungs/Pleura: Lungs are clear. No pleural effusion or pneumothorax. Upper Abdomen: No acute abnormality Musculoskeletal: A moderate apex RIGHT thoracic scoliosis is noted. No acute or suspicious bony abnormalities are identified. Review of the MIP images confirms the above findings. IMPRESSION: 1. No evidence of acute abnormality.  No pulmonary emboli. 2. Moderate apex RIGHT thoracic scoliosis. Electronically Signed   By: Harmon Pier M.D.   On: 09/11/2017 20:44   [place] Mfm Ob Comp + 14 Wk  Result Date: 09/11/2017 ----------------------------------------------------------------------  OBSTETRICS REPORT                      (Signed Final 09/11/2017 12:15 pm) ---------------------------------------------------------------------- Patient Info  ID #:       [id number]                          D.O.B.:  12-27-1990 (26 yrs)  Name:       Gabrielle Huff                  Visit Date: 09/11/2017 11:20 am ---------------------------------------------------------------------- Performed By  Performed By:     Eden Lathe BS      Ref. Address:     [address]  [address]                                                             Road                                                             [address]                                                             [place]                                                              [id number]  Attending:        Erle Crocker MD     Location:         [facility]  Referred By:      Center for                    [facility]                    [facility] - [facility] ---------------------------------------------------------------------- Orders   #  Description                                 Code   1  [place] MFM OB COMP + 14 WK                      60454.09  ----------------------------------------------------------------------   #  Ordered By               Order #        Accession #    Episode #   1  RACHELLE Marjo Bicker          [id number]      [id number]     [id number]  ---------------------------------------------------------------------- Indications   [redacted] weeks gestation of pregnancy                Z3A.19   Encounter for antenatal screening for          Z36.3   malformations  ---------------------------------------------------------------------- OB History  Gravidity:    1         Term:   0        Prem:   0        SAB:   0  TOP:          0       Ectopic:  0        Living: 0 ---------------------------------------------------------------------- Fetal Evaluation  Num Of Fetuses:     1  Fetal Heart         150  Rate(bpm):  Cardiac Activity:   Observed  Presentation:       Cephalic  Placenta:           Anterior, above cervical os  P. Cord Insertion:  Visualized  Amniotic Fluid  AFI FV:      Subjectively within normal limits                              Largest Pocket(cm)                              5.22 ---------------------------------------------------------------------- Biometry  BPD:      43.7  mm     G. Age:  19w 1d         60  %    CI:         71.1   %    70 - 86                                                          FL/HC:      17.9   %    16.1 - 18.3  HC:      165.1  mm     G. Age:  19w 1d         54  %    HC/AC:      1.22        1.09 - 1.39  AC:      135.2  mm     G. Age:  19w 0d         45  %    FL/BPD:     67.7   %  FL:       29.6  mm     G.  Age:  19w 1d         48  %    FL/AC:      21.9   %    20 - 24  HUM:      29.5  mm     G. Age:  19w 5d         67  %  CER:        20  mm     G. Age:  19w 0d         50  %  NFT:       4.9  mm  CM:        4.2  mm  Est. FW:     272  gm    0 lb 10 oz      46  % ---------------------------------------------------------------------- Gestational Age  LMP:           19w 0d        Date:  05/01/17                 EDD:   02/05/18  U/S Today:     19w 1d  EDD:   02/04/18  Best:          19w 0d     Det. By:  LMP  (05/01/17)          EDD:   02/05/18 ---------------------------------------------------------------------- Anatomy  Cranium:               Appears normal         LVOT:                   Appears normal  Cavum:                 Appears normal         Aortic Arch:            Appears normal  Ventricles:            Appears normal         Ductal Arch:            Appears normal  Choroid Plexus:        Appears normal         Diaphragm:              Appears normal  Cerebellum:            Appears normal         Stomach:                Appears normal, left                                                                        sided  Posterior Fossa:       Appears normal         Abdomen:                Appears normal  Nuchal Fold:           Appears normal         Abdominal Wall:         Appears nml (cord                                                                        insert, abd wall)  Face:                  Appears normal         Cord Vessels:           Appears normal (3                         (orbits and profile)                           vessel cord)  Lips:                  Appears normal         Kidneys:  Appear normal  Palate:                Appears normal         Bladder:                Appears normal  Thoracic:              Appears normal         Spine:                  Appears normal  Heart:                 Appears normal         Upper Extremities:      Appears normal                          (4CH, axis, and                         situs)  RVOT:                  Appears normal         Lower Extremities:      Appears normal  Other:  Fetus appears to be a female. Heels and 5th digit visualized. Nasal          bone visualized. Open hands visualized. ---------------------------------------------------------------------- Cervix Uterus Adnexa  Cervix  Length:            3.6  cm.  Normal appearance by transabdominal scan.  Uterus  No abnormality visualized.  Left Ovary  Within normal limits.  Right Ovary  Within normal limits.  Cul De Sac:   No free fluid seen.  Adnexa:       No abnormality visualized. ---------------------------------------------------------------------- Impression  Indication: 27 yr old G1P0 at [redacted]w[redacted]d for fetal anatomic survey.  Remote read.  Findings:  1. Single intrauterine pregnancy with normal cardiac activity.  2. Fetal biometry is consistent with dating.  3. Anterior placenta without evidence of previa.  4. Normal amniotic fluid volume.  5. Normal transabdominal cervical length.  6. No adnexal masses seen.  7. Normal fetal anatomic survey. ---------------------------------------------------------------------- Recommendations  1. Appropriate fetal growth.  2. Normal fetal anatomic survey.  3. Low risk cell free fetal DNA and MSAFP  4. Recommend follow up ultrasounds as clinically indicated ----------------------------------------------------------------------                Erle Crocker, MD Electronically Signed Final Report   09/11/2017 12:15 pm ----------------------------------------------------------------------   Assessment and Plan:   Khamya Topp is a 27 y.o. female, [redacted] weeks pregnant, with a hx of anemia and vitamin D difciency but no other significant PMH, who is being seen today for the evaluation of chest pain and palpitations, at the request of Dr. Marland Mcalpine, Internal Medicine. Gabrielle Huff is anemic with Hgb at 7.8.   1. Chest Pain and Palpitations:  suspect symptomatic anemia w/ hgb at 7.8. She is getting a blood transfusion. EKG shows NSR but Gabrielle Huff reportedly had sinus tach in the 120s and hypoxia with drop in O2 sats in the 80s w/ ambulation in the ED, also consistent with severe anemia. Chest CT is negative for PE. Tropoins are negative x 2. 2D echo pending. She has no significant risk factors for CAD. Recommend management of anemia. No further cardiac w/u at this time other than echo. May also consider checking  TSH, but suspect anemia is cause of palpitations/tachycardia.   2. Anemia: management and w/u per IM.   For questions or updates, please contact [facility] Please consult [email or web address] for contact info under Cardiology/STEMI.   Signed, Robbie Lis, PA-C  09/12/2017 11:22 AM   Patient seen, examined. Available data reviewed. Agree with findings, assessment, and plan as outlined by Robbie Lis, PA-C.  On my exam the patient is alert and oriented in no distress.  She is a healthy-appearing young woman breathing comfortably at rest.  JVP is normal.  There are no carotid bruits.  Lungs are clear bilaterally.  Heart is regular rate and rhythm with a soft systolic ejection murmur at the left lower sternal border.  There is no diastolic murmur.  Abdomen is soft, gravid, nontender.  Extremities have no edema.  EKG shows normal sinus rhythm.  Telemetry review also demonstrates normal sinus rhythm with periods of sinus tachycardia up to 120 bpm.  The patient has heart palpitations and exertional dyspnea 5 months into her pregnancy.  There is associated anemia and she is currently undergoing a blood transfusion.  Her cardiac exam is benign and there are no arrhythmias identified on telemetry.  Would continue with supportive care, push fluids, and will await the results of her 2D echocardiogram which will be done today.  No further inpatient cardiac evaluation is planned unless her echo demonstrates significant abnormalities.  Otherwise  agree with findings recommendations detailed above.  Tonny Bollman, M.D. 09/12/2017 12:11 PM

## 2017-09-12 NOTE — Plan of Care (Signed)
  Problem: Clinical Measurements: Goal: Ability to maintain clinical measurements within normal limits will improve Outcome: Progressing Goal: Will remain free from infection Outcome: Progressing Goal: Diagnostic test results will improve Outcome: Progressing Goal: Respiratory complications will improve Outcome: Progressing Goal: Cardiovascular complication will be avoided Outcome: Progressing   Problem: Activity: Goal: Risk for activity intolerance will decrease Outcome: Progressing   Problem: Nutrition: Goal: Adequate nutrition will be maintained Outcome: Progressing   Problem: Coping: Goal: Level of anxiety will decrease Outcome: Progressing   Problem: Elimination: Goal: Will not experience complications related to bowel motility Outcome: Progressing Goal: Will not experience complications related to urinary retention Outcome: Progressing   Problem: Pain Managment: Goal: General experience of comfort will improve Outcome: Progressing   Problem: Skin Integrity: Goal: Risk for impaired skin integrity will decrease Outcome: Progressing   

## 2017-09-12 NOTE — Progress Notes (Signed)
Per Deirdre Pippins, RN (ED AD) baby's HR 140's with good fetal heart tones, sitting left of naval.

## 2017-09-12 NOTE — Progress Notes (Signed)
  Echocardiogram Echocardiogram  has been performed.  Gabrielle Huff 09/12/2017, 2:26 PM

## 2017-09-13 DIAGNOSIS — R0602 Shortness of breath: Secondary | ICD-10-CM | POA: Diagnosis not present

## 2017-09-13 DIAGNOSIS — R079 Chest pain, unspecified: Secondary | ICD-10-CM | POA: Diagnosis not present

## 2017-09-13 DIAGNOSIS — D649 Anemia, unspecified: Secondary | ICD-10-CM | POA: Diagnosis not present

## 2017-09-13 LAB — CBC WITH DIFFERENTIAL/PLATELET
BASOS ABS: 0 10*3/uL (ref 0.0–0.1)
Basophils Relative: 0 %
Eosinophils Absolute: 0.1 10*3/uL (ref 0.0–0.7)
Eosinophils Relative: 1 %
HEMATOCRIT: 28.7 % — AB (ref 36.0–46.0)
HEMOGLOBIN: 8.9 g/dL — AB (ref 12.0–15.0)
LYMPHS PCT: 13 %
Lymphs Abs: 1.5 10*3/uL (ref 0.7–4.0)
MCH: 20.9 pg — ABNORMAL LOW (ref 26.0–34.0)
MCHC: 31 g/dL (ref 30.0–36.0)
MCV: 67.4 fL — ABNORMAL LOW (ref 78.0–100.0)
MONOS PCT: 10 %
Monocytes Absolute: 1.1 10*3/uL — ABNORMAL HIGH (ref 0.1–1.0)
NEUTROS PCT: 76 %
Neutro Abs: 8.5 10*3/uL — ABNORMAL HIGH (ref 1.7–7.7)
Platelets: 255 10*3/uL (ref 150–400)
RBC: 4.26 MIL/uL (ref 3.87–5.11)
RDW: 17.7 % — AB (ref 11.5–15.5)
WBC: 11.2 10*3/uL — AB (ref 4.0–10.5)

## 2017-09-13 LAB — HIV ANTIBODY (ROUTINE TESTING W REFLEX): HIV SCREEN 4TH GENERATION: NONREACTIVE

## 2017-09-13 LAB — TYPE AND SCREEN
ABO/RH(D): O POS
ANTIBODY SCREEN: NEGATIVE
UNIT DIVISION: 0

## 2017-09-13 LAB — COMPREHENSIVE METABOLIC PANEL
ALBUMIN: 2.7 g/dL — AB (ref 3.5–5.0)
ALK PHOS: 42 U/L (ref 38–126)
ALT: 9 U/L — ABNORMAL LOW (ref 14–54)
AST: 18 U/L (ref 15–41)
Anion gap: 8 (ref 5–15)
BILIRUBIN TOTAL: 0.5 mg/dL (ref 0.3–1.2)
BUN: 6 mg/dL (ref 6–20)
CO2: 20 mmol/L — ABNORMAL LOW (ref 22–32)
Calcium: 8.5 mg/dL — ABNORMAL LOW (ref 8.9–10.3)
Chloride: 108 mmol/L (ref 101–111)
Creatinine, Ser: 0.4 mg/dL — ABNORMAL LOW (ref 0.44–1.00)
GFR calc Af Amer: >60 mL/min (ref >60)
GFR calc non Af Amer: >60 mL/min (ref >60)
GLUCOSE: 76 mg/dL (ref 65–99)
POTASSIUM: 3.7 mmol/L (ref 3.5–5.1)
Sodium: 136 mmol/L (ref 135–145)
TOTAL PROTEIN: 5.8 g/dL — AB (ref 6.5–8.1)

## 2017-09-13 LAB — MAGNESIUM: Magnesium: 1.6 mg/dL — ABNORMAL LOW (ref 1.7–2.4)

## 2017-09-13 LAB — PHOSPHORUS: Phosphorus: 4.2 mg/dL (ref 2.5–4.6)

## 2017-09-13 LAB — BPAM RBC
BLOOD PRODUCT EXPIRATION DATE: 201906212359
ISSUE DATE / TIME: 201905241059
Unit Type and Rh: 5100

## 2017-09-13 MED ORDER — FERROUS SULFATE 325 (65 FE) MG PO TABS: 325.0000 mg | ORAL_TABLET | Freq: Every day | ORAL | 3 refills | Status: DC

## 2017-09-13 MED ORDER — MAGNESIUM SULFATE 2 GM/50ML IV SOLN
2.0000 g | Freq: Once | INTRAVENOUS | Status: AC
  Administered 2017-09-13: 2 g via INTRAVENOUS
  Filled 2017-09-13: qty 50

## 2017-09-13 NOTE — Progress Notes (Signed)
Patient ambulated on pulse ox monitor. POX at rest 100%. POX during ambulation halfway around nurses station ranged from 94-98%.

## 2017-09-13 NOTE — Progress Notes (Signed)
Fetal heart tones checked by ER RN Aram Beecham. Heart rate 148.  Lurline Idol [facility]

## 2017-09-13 NOTE — Discharge Summary (Signed)
Physician Discharge Summary  Nayda Riesen ZOX:[id number] DOB: 01-08-1991 DOA: 09/11/2017  PCP: Patient, No Pcp Per  Admit date: 09/11/2017 Discharge date: 09/13/2017  Admitted From: Home Disposition: Home  Recommendations for Outpatient Follow-up:  1. Follow up with PCP in 1-2 weeks 2. Follow up with OB-GYN within 1-2 weeks 3. Please obtain CMP/CBC, Mag, Phos in one week 4. Please follow up on the following pending results:  Home Health: No  Equipment/Devices: None    Discharge Condition: Stable CODE STATUS: FULL CODE Diet recommendation: Regular Diet  Brief/Interim Summary: The patient is a 27 year old African-American female with a past medical history of anemia, vitamin D deficiency, who is [redacted] weeks pregnant who presented to [facility] with a chief complaint of exertional chest pain or shortness of breath associated with palpitations over a month duration.  Symptoms were worsened when she is standing and she denies any GI or GU blood losses. She presented to her gynecologist office who then sent subsequently sent her to the ED for evaluation.  She was admitted for symptomatic anemia as well as acute chest pain.  Cardiology was consulted yesterday and are evaluating patient and recommending continue current plan of care and obtaining echocardiogram..    Patient was being transfused 1 unit of PRBCs.  Anemia panel showed the patient's iron was severely low.  She was started on iron supplementation 325 mg of ferrous sulfate in addition to her prenatal vitamins.  Patient's cardiac troponins were negative and echocardiogram was normal.  She improved after her 1 unit of PRBCs and ambulated to the halls without desaturating.  Cardiology recommended no further inpatient cardiac evaluation as her echo was not abnormal.  She is deemed medically stable to be discharged home at this time will follow-up with her PCP as well as her OB/GYN physician in the outpatient setting very closely.  Discharge  Diagnoses:  Active Problems:   Chest pain   SOB (shortness of breath)  Exertional Chest Pain with shortness of breath, palpitations, dizziness, improved -No significant risk factors.   -Never Smoker.  Denies illicit drug use.  - I-STAT troponin negative and subsequent troponin is negative.  -CTA chest- No PE.  -Echocardiogram Normal -Trops X3 Negative -UDS Negative -Walk screen done and patient did not desaturate this time or have exertional dyspnea.  Acute Symptomatic Microcytic Anemia -Hb/Hct dropped to 7.8/26.1.  -Likely symptomatic iron defc anemia causing symptoms, aggravated by anemia of pregnancy.  -Hgb baseline 2015, prior to pregnancy close to 11.   -Ferritin- 9, 08/21/17-indicating significant iron deficiency anemia, chronically low -Iron level is 20, U IBC was 43, TIBC is 503, and saturation ratios were 4% -Start iron supplementation 325mg  daily, in addition to prenatal vitamins -She is typed and screened and given 1 unit of PRBCs and hemoglobin/hematocrit improved to 8.9/28.7 -No longer symptomatic recommend close follow-up with her OB/GYN  Gravid status -hCG was 30,847 -1st pregnancy. OB U/S 09/11/17- 19W, normal-appearing fetus -Fetal heart tones every shift and last fetal heart tones checked by ER nurse Aram Beecham showed a heart rate of 148 -Follow-up with OB/GYN as outpatient  Hypomagnesemia -Patient's magnesium level is 1.6 -Replete with IV mag sulfate 2 g -Continue to monitor and replete as necessary -Repeat magnesium level as an outpatient  Leukocytosis -Mild at 11.2 -No signs and symptoms of any infection -Continue to monitor repeat CBC as an outpatient.  Discharge Instructions  Allergies as of 09/13/2017   No Known Allergies     Medication List    STOP taking these medications  metoCLOPramide 10 MG tablet Commonly known as:  REGLAN   PRENATAL COMPLETE 14-0.4 MG Tabs   terconazole 0.8 % vaginal cream Commonly known as:  TERAZOL 3     TAKE  these medications   CITRANATAL BLOOM 90-1 MG Tabs Take 1 tablet by mouth daily.   ferrous sulfate 325 (65 FE) MG tablet Take 1 tablet (325 mg total) by mouth daily with breakfast. Start taking on:  09/14/2017   Vitamin D (Ergocalciferol) 50000 units Caps capsule Commonly known as:  DRISDOL Take 1 capsule (50,000 Units total) by mouth every 7 (seven) days.       No Known Allergies  Consultations:  None  Procedures/Studies: Dg Chest 2 View  Result Date: 09/11/2017 CLINICAL DATA:  Chest pain and shortness of breath EXAM: CHEST - 2 VIEW COMPARISON:  None. FINDINGS: Lungs are clear. Heart size and pulmonary vascularity are normal. No adenopathy. There is lower thoracic dextroscoliosis. IMPRESSION: No edema or consolidation. Electronically Signed   By: Bretta Bang III M.D.   On: 09/11/2017 15:45   Ct Angio Chest Pe W And/or Wo Contrast  Result Date: 09/11/2017 CLINICAL DATA:  27 year old pregnant female with chest pain and shortness of breath for 1 month. EXAM: CT ANGIOGRAPHY CHEST WITH CONTRAST TECHNIQUE: Multidetector CT imaging of the chest was performed using the standard protocol during bolus administration of intravenous contrast. Multiplanar CT image reconstructions and MIPs were obtained to evaluate the vascular anatomy. CONTRAST:  80 cc intravenous Isovue 370 COMPARISON:  09/11/2017 chest radiograph FINDINGS: Cardiovascular: This is a technically adequate study although respiratory motion artifact decreases sensitivity in some portions of the lungs. No pulmonary emboli are identified. Heart size normal. No thoracic aortic aneurysm or pericardial effusion. Mediastinum/Nodes: No enlarged mediastinal, hilar, or axillary lymph nodes. Thyroid gland, trachea, and esophagus demonstrate no significant findings. Lungs/Pleura: Lungs are clear. No pleural effusion or pneumothorax. Upper Abdomen: No acute abnormality Musculoskeletal: A moderate apex RIGHT thoracic scoliosis is noted. No acute  or suspicious bony abnormalities are identified. Review of the MIP images confirms the above findings. IMPRESSION: 1. No evidence of acute abnormality.  No pulmonary emboli. 2. Moderate apex RIGHT thoracic scoliosis. Electronically Signed   By: Harmon Pier M.D.   On: 09/11/2017 20:44   [place] Mfm Ob Comp + 14 Wk  Result Date: 09/11/2017 ----------------------------------------------------------------------  OBSTETRICS REPORT                      (Signed Final 09/11/2017 12:15 pm) ---------------------------------------------------------------------- Patient Info  ID #:       [id number]                          D.O.B.:  02-08-1991 (26 yrs)  Name:       Gabrielle Huff                  Visit Date: 09/11/2017 11:20 am ---------------------------------------------------------------------- Performed By  Performed By:     Eden Lathe BS      Ref. Address:     [address]                    RDMS RVT  [address]                                                             [place] [address]                                                             [place]                                                             [address]  Attending:        Erle Crocker MD     Location:         [facility]  Referred By:      Center for                    [facility]                    Healthcare - [facility] ---------------------------------------------------------------------- Orders   #  Description                                 Code   1  [place] MFM OB COMP + 14 WK                      [id number]  ----------------------------------------------------------------------   #  Ordered By               Order #        Accession #    Episode #   1  RACHELLE Marjo Bicker          [id number]      [id number]     [id number]  ---------------------------------------------------------------------- Indications   [redacted] weeks gestation of pregnancy                Z3A.19   Encounter for antenatal screening for           Z36.3   malformations  ---------------------------------------------------------------------- OB History  Gravidity:    1         Term:   0        Prem:   0        SAB:   0  TOP:          0       Ectopic:  0        Living: 0 ---------------------------------------------------------------------- Fetal Evaluation  Num Of Fetuses:     1  Fetal Heart         150  Rate(bpm):  Cardiac Activity:   Observed  Presentation:       Cephalic  Placenta:           Anterior, above cervical os  P. Cord Insertion:  Visualized  Amniotic Fluid  AFI FV:      Subjectively  within normal limits                              Largest Pocket(cm)                              5.22 ---------------------------------------------------------------------- Biometry  BPD:      43.7  mm     G. Age:  19w 1d         60  %    CI:         71.1   %    70 - 86                                                          FL/HC:      17.9   %    16.1 - 18.3  HC:      165.1  mm     G. Age:  19w 1d         54  %    HC/AC:      1.22        1.09 - 1.39  AC:      135.2  mm     G. Age:  19w 0d         45  %    FL/BPD:     67.7   %  FL:       29.6  mm     G. Age:  19w 1d         48  %    FL/AC:      21.9   %    20 - 24  HUM:      29.5  mm     G. Age:  19w 5d         67  %  CER:        20  mm     G. Age:  19w 0d         50  %  NFT:       4.9  mm  CM:        4.2  mm  Est. FW:     272  gm    0 lb 10 oz      46  % ---------------------------------------------------------------------- Gestational Age  LMP:           19w 0d        Date:  05/01/17                 EDD:   02/05/18  U/S Today:     19w 1d                                        EDD:   02/04/18  Best:          19w 0d     Det. By:  LMP  (05/01/17)          EDD:   02/05/18 ---------------------------------------------------------------------- Anatomy  Cranium:               Appears normal  LVOT:                   Appears normal  Cavum:                 Appears normal         Aortic Arch:            Appears normal   Ventricles:            Appears normal         Ductal Arch:            Appears normal  Choroid Plexus:        Appears normal         Diaphragm:              Appears normal  Cerebellum:            Appears normal         Stomach:                Appears normal, left                                                                        sided  Posterior Fossa:       Appears normal         Abdomen:                Appears normal  Nuchal Fold:           Appears normal         Abdominal Wall:         Appears nml (cord                                                                        insert, abd wall)  Face:                  Appears normal         Cord Vessels:           Appears normal (3                         (orbits and profile)                           vessel cord)  Lips:                  Appears normal         Kidneys:                Appear normal  Palate:                Appears normal         Bladder:                Appears normal  Thoracic:  Appears normal         Spine:                  Appears normal  Heart:                 Appears normal         Upper Extremities:      Appears normal                         (4CH, axis, and                         situs)  RVOT:                  Appears normal         Lower Extremities:      Appears normal  Other:  Fetus appears to be a female. Heels and 5th digit visualized. Nasal          bone visualized. Open hands visualized. ---------------------------------------------------------------------- Cervix Uterus Adnexa  Cervix  Length:            3.6  cm.  Normal appearance by transabdominal scan.  Uterus  No abnormality visualized.  Left Ovary  Within normal limits.  Right Ovary  Within normal limits.  Cul De Sac:   No free fluid seen.  Adnexa:       No abnormality visualized. ---------------------------------------------------------------------- Impression  Indication: 27 yr old G1P0 at [redacted]w[redacted]d for fetal anatomic survey.  Remote read.  Findings:  1. Single  intrauterine pregnancy with normal cardiac activity.  2. Fetal biometry is consistent with dating.  3. Anterior placenta without evidence of previa.  4. Normal amniotic fluid volume.  5. Normal transabdominal cervical length.  6. No adnexal masses seen.  7. Normal fetal anatomic survey. ---------------------------------------------------------------------- Recommendations  1. Appropriate fetal growth.  2. Normal fetal anatomic survey.  3. Low risk cell free fetal DNA and MSAFP  4. Recommend follow up ultrasounds as clinically indicated ----------------------------------------------------------------------                Erle Crocker, MD Electronically Signed Final Report   09/11/2017 12:15 pm ----------------------------------------------------------------------   ECHOCARDIOGRAM ------------------------------------------------------------------- Study Conclusions  - Left ventricle: The cavity size was normal. Wall thickness was   normal. Systolic function was normal. The estimated ejection   fraction was in the range of 60% to 65%. Wall motion was normal;   there were no regional wall motion abnormalities. Left   ventricular diastolic function parameters were normal. - Left atrium: The atrium was normal in size. - Inferior vena cava: The vessel was normal in size. The   respirophasic diameter changes were in the normal range (>= 50%),   consistent with normal central venous pressure.  Impressions:  - Normal study.  Subjective: Seen and examined at bedside and was doing well.  Ambulated the halls without dyspnea on exertion.  Good and denies any chest pain or shortness of breath.  Ready to go home  Discharge Exam: Vitals:   09/12/17 2213 09/13/17 0608  BP: 98/60 (!) 90/49  Pulse: 78 79  Resp: 18 18  Temp: 98.3 F (36.8 C) 97.7 F (36.5 C)  SpO2: 100% 99%   Vitals:   09/12/17 1402 09/12/17 1600 09/12/17 2213 09/13/17 0608  BP: (!) 100/59  98/60 (!) 90/49  Pulse: 86  78 79   Resp: 16  18 18  Temp: 98.3 F (36.8 C)  98.3 F (36.8 C) 97.7 F (36.5 C)  TempSrc: Oral  Oral Oral  SpO2: 100% 100% 100% 99%  Height:       General: Pt is alert, awake, not in acute distress Cardiovascular: RRR, S1/S2 +, no rubs, no gallops Respiratory: CTA bilaterally, no wheezing, no rhonchi Abdominal: Soft, NT, Slightly distended due to pregnancy, bowel sounds + Extremities: no edema, no cyanosis  The results of significant diagnostics from this hospitalization (including imaging, microbiology, ancillary and laboratory) are listed below for reference.    Microbiology: No results found for this or any previous visit (from the past 240 hour(s)).   Labs: BNP (last 3 results) No results for input(s): BNP in the last 8760 hours. Basic Metabolic Panel: Recent Labs  Lab 09/11/17 1518 09/12/17 0604 09/13/17 0423  NA 135 136 136  K 3.9 3.5 3.7  CL 105 107 108  CO2 22 21* 20*  GLUCOSE 106* 82 76  BUN 6 <5* 6  CREATININE 0.57 0.47 0.40*  CALCIUM 8.6* 8.4* 8.5*  MG  --  1.7 1.6*  PHOS  --  3.2 4.2   Liver Function Tests: Recent Labs  Lab 09/12/17 0604 09/13/17 0423  AST 15 18  ALT 8* 9*  ALKPHOS 44 42  BILITOT 0.3 0.5  PROT 5.9* 5.8*  ALBUMIN 2.8* 2.7*   No results for input(s): LIPASE, AMYLASE in the last 168 hours. No results for input(s): AMMONIA in the last 168 hours. CBC: Recent Labs  Lab 09/11/17 1518 09/12/17 0604 09/13/17 0423  WBC 11.5* 10.0 11.2*  NEUTROABS  --  7.4 8.5*  HGB 8.8* 7.8*  8.0* 8.9*  HCT 29.9* 26.5*  26.1* 28.7*  MCV 65.9* 66.1* 67.4*  PLT 285 273 255   Cardiac Enzymes: Recent Labs  Lab 09/12/17 0009 09/12/17 0604 09/12/17 1137  TROPONINI <0.03 <0.03 <0.03   BNP: Invalid input(s): POCBNP CBG: No results for input(s): GLUCAP in the last 168 hours. D-Dimer No results for input(s): DDIMER in the last 72 hours. Hgb A1c No results for input(s): HGBA1C in the last 72 hours. Lipid Profile No results for input(s): CHOL,  HDL, LDLCALC, TRIG, CHOLHDL, LDLDIRECT in the last 72 hours. Thyroid function studies No results for input(s): TSH, T4TOTAL, T3FREE, THYROIDAB in the last 72 hours.  Invalid input(s): FREET3 Anemia work up Recent Labs    09/12/17 0604 09/12/17 0608  TIBC  --  503*  IRON  --  20*  RETICCTPCT 1.6  --    Urinalysis    Component Value Date/Time   COLORURINE YELLOW 07/14/2017 0317   APPEARANCEUR CLEAR 07/14/2017 0317   LABSPEC 1.019 07/14/2017 0317   PHURINE 6.0 07/14/2017 0317   GLUCOSEU NEGATIVE 07/14/2017 0317   HGBUR NEGATIVE 07/14/2017 0317   BILIRUBINUR NEGATIVE 07/14/2017 0317   BILIRUBINUR n 06/20/2015 1457   KETONESUR 5 (A) 07/14/2017 0317   PROTEINUR NEGATIVE 07/14/2017 0317   UROBILINOGEN negative 06/20/2015 1457   NITRITE NEGATIVE 07/14/2017 0317   LEUKOCYTESUR NEGATIVE 07/14/2017 0317   Sepsis Labs Invalid input(s): PROCALCITONIN,  WBC,  LACTICIDVEN Microbiology No results found for this or any previous visit (from the past 240 hour(s)).  Time coordinating discharge: 35 minutes  SIGNED:  Merlene Laughter, DO [facility] 09/13/2017, 11:10 AM Pager [phone]  If 7PM-7AM, please contact night-coverage [email or web address] Password [email or web address]

## 2017-09-18 ENCOUNTER — Ambulatory Visit (INDEPENDENT_AMBULATORY_CARE_PROVIDER_SITE_OTHER): Payer: PRIVATE HEALTH INSURANCE | Admitting: Certified Nurse Midwife

## 2017-09-18 ENCOUNTER — Encounter: Payer: Self-pay | Admitting: Certified Nurse Midwife

## 2017-09-18 VITALS — BP 114/67 | HR 95 | Wt 108.7 lb

## 2017-09-18 DIAGNOSIS — E559 Vitamin D deficiency, unspecified: Secondary | ICD-10-CM

## 2017-09-18 DIAGNOSIS — O26892 Other specified pregnancy related conditions, second trimester: Secondary | ICD-10-CM

## 2017-09-18 DIAGNOSIS — Z3400 Encounter for supervision of normal first pregnancy, unspecified trimester: Secondary | ICD-10-CM

## 2017-09-18 DIAGNOSIS — O99012 Anemia complicating pregnancy, second trimester: Secondary | ICD-10-CM

## 2017-09-18 DIAGNOSIS — Z8679 Personal history of other diseases of the circulatory system: Secondary | ICD-10-CM

## 2017-09-18 DIAGNOSIS — R12 Heartburn: Secondary | ICD-10-CM

## 2017-09-18 MED ORDER — OMEPRAZOLE 20 MG PO CPDR: 20.0000 mg | DELAYED_RELEASE_CAPSULE | Freq: Two times a day (BID) | ORAL | 5 refills | Status: DC

## 2017-09-18 NOTE — Progress Notes (Signed)
   PRENATAL VISIT NOTE  Subjective:  Gabrielle Huff is a 27 y.o. G1P0000 at 76w0dbeing seen today for ongoing prenatal care.  She is currently monitored for the following issues for this low-risk pregnancy and has Supervision of normal first pregnancy, antepartum; History of heart murmur in childhood; Scoliosis; Family history of identical twins; Late prenatal care; Vitamin D deficiency; Anemia during pregnancy in second trimester; Chest pain; and SOB (shortness of breath) on their problem list.  Patient reports heartburn, no bleeding, no contractions, no cramping, no leaking and ?heartburn/reflux, chest pain midsternum, does not radiate and never goes away, was present at ED the other day.  Contractions: Not present. Vag. Bleeding: None.  Movement: Present. Denies leaking of fluid.   The following portions of the patient's history were reviewed and updated as appropriate: allergies, current medications, past family history, past medical history, past social history, past surgical history and problem list. Problem list updated.  Objective:   Vitals:   09/18/17 0939  BP: 114/67  Pulse: 95  Weight: 108 lb 11.2 oz (49.3 kg)   Pulse Ox: 98% on RA  Fetal Status: Fetal Heart Rate (bpm): 150; doppler Fundal Height: 20 cm Movement: Present     General:  Alert, oriented and cooperative. Patient is in no acute distress.  Skin: Skin is warm and dry. No rash noted.   Cardiovascular: Normal heart rate noted  Respiratory: Normal respiratory effort, no problems with respiration noted  Abdomen: Soft, gravid, appropriate for gestational age.  Pain/Pressure: Absent     Pelvic: Cervical exam deferred        Extremities: Normal range of motion.  Edema: None  Mental Status: Normal mood and affect. Normal behavior. Normal judgment and thought content.   Assessment and Plan:  Pregnancy: G1P0000 at 261w0d1. Supervision of normal first pregnancy, antepartum      Notes reviewed from 09/13/17, S/P 1 unit  blood transfusion.   - Comp Met (CMET) - CBC - TSH Pregnancy  2. Anemia during pregnancy in second trimester     Taking Iron supplementation - Comp Met (CMET) - CBC  3. History of heart murmur in childhood     Previous cardiology referral placed, scheduled for 09/20/17.  - Ambulatory referral to Cardiology  4. Vitamin D deficiency     Taking weekly vitamin D  5. Heartburn during pregnancy in second trimester     Reflux precautions given.  - omeprazole (PRILOSEC) 20 MG capsule; Take 1 capsule (20 mg total) by mouth 2 (two) times daily before a meal.  Dispense: 60 capsule; Refill: 5  Preterm labor symptoms and general obstetric precautions including but not limited to vaginal bleeding, contractions, leaking of fluid and fetal movement were reviewed in detail with the patient. Please refer to After Visit Summary for other counseling recommendations.  Return in about 1 month (around 10/16/2017) for ROGibson Future Appointments  Date Time Provider [place]6/06/2017 12:00 PM [facility]  10/16/2017  2:00 PM DeKandis Cocking, CNM [facility] None  10/20/2017 10:30 AM HaDaune PerchNP [facility]    RaMorene CrockerCNM

## 2017-09-19 LAB — COMPREHENSIVE METABOLIC PANEL
A/G RATIO: 1.3 (ref 1.2–2.2)
ALBUMIN: 3.5 g/dL (ref 3.5–5.5)
ALK PHOS: 52 IU/L (ref 39–117)
ALT: 8 IU/L (ref 0–32)
AST: 16 IU/L (ref 0–40)
BILIRUBIN TOTAL: 0.4 mg/dL (ref 0.0–1.2)
BUN / CREAT RATIO: 14 (ref 9–23)
BUN: 7 mg/dL (ref 6–20)
CHLORIDE: 104 mmol/L (ref 96–106)
CO2: 19 mmol/L — ABNORMAL LOW (ref 20–29)
Calcium: 8.7 mg/dL (ref 8.7–10.2)
Creatinine, Ser: 0.51 mg/dL — ABNORMAL LOW (ref 0.57–1.00)
GFR calc Af Amer: 156 mL/min/{1.73_m2} (ref >59)
GFR calc non Af Amer: 135 mL/min/{1.73_m2} (ref >59)
GLOBULIN, TOTAL: 2.8 g/dL (ref 1.5–4.5)
Glucose: 76 mg/dL (ref 65–99)
POTASSIUM: 4.4 mmol/L (ref 3.5–5.2)
SODIUM: 139 mmol/L (ref 134–144)
Total Protein: 6.3 g/dL (ref 6.0–8.5)

## 2017-09-19 LAB — CBC
Hematocrit: 34.5 % (ref 34.0–46.6)
Hemoglobin: 10 g/dL — ABNORMAL LOW (ref 11.1–15.9)
MCH: 20.7 pg — ABNORMAL LOW (ref 26.6–33.0)
MCHC: 29 g/dL — ABNORMAL LOW (ref 31.5–35.7)
MCV: 71 fL — AB (ref 79–97)
PLATELETS: 250 10*3/uL (ref 150–450)
RBC: 4.83 x10E6/uL (ref 3.77–5.28)
RDW: 17.9 % — AB (ref 12.3–15.4)
WBC: 10.5 10*3/uL (ref 3.4–10.8)

## 2017-09-19 LAB — TSH PREGNANCY: TSH PREGNANCY: 2.58 u[IU]/mL (ref 0.450–4.500)

## 2017-09-22 ENCOUNTER — Ambulatory Visit (INDEPENDENT_AMBULATORY_CARE_PROVIDER_SITE_OTHER): Payer: PRIVATE HEALTH INSURANCE

## 2017-09-22 DIAGNOSIS — R002 Palpitations: Secondary | ICD-10-CM

## 2017-10-14 ENCOUNTER — Telehealth: Payer: Self-pay | Admitting: Cardiovascular Disease

## 2017-10-14 NOTE — Telephone Encounter (Signed)
New message    Pt is calling back about monitor results

## 2017-10-14 NOTE — Telephone Encounter (Signed)
-----   Message from Tonny BollmanMichael Cooper, MD sent at 10/11/2017  3:16 PM EDT ----- Benign holter

## 2017-10-14 NOTE — Telephone Encounter (Signed)
Informed patient of results and verbal understanding expressed.  

## 2017-10-16 ENCOUNTER — Ambulatory Visit (INDEPENDENT_AMBULATORY_CARE_PROVIDER_SITE_OTHER): Payer: PRIVATE HEALTH INSURANCE | Admitting: Certified Nurse Midwife

## 2017-10-16 ENCOUNTER — Encounter: Payer: Self-pay | Admitting: Certified Nurse Midwife

## 2017-10-16 VITALS — BP 102/68 | HR 97 | Wt 110.2 lb

## 2017-10-16 DIAGNOSIS — Z3400 Encounter for supervision of normal first pregnancy, unspecified trimester: Secondary | ICD-10-CM

## 2017-10-16 DIAGNOSIS — D649 Anemia, unspecified: Secondary | ICD-10-CM

## 2017-10-16 DIAGNOSIS — O99012 Anemia complicating pregnancy, second trimester: Secondary | ICD-10-CM

## 2017-10-16 DIAGNOSIS — Z3402 Encounter for supervision of normal first pregnancy, second trimester: Secondary | ICD-10-CM

## 2017-10-16 DIAGNOSIS — E559 Vitamin D deficiency, unspecified: Secondary | ICD-10-CM

## 2017-10-16 MED ORDER — FUSION PLUS PO CAPS
1.0000 | ORAL_CAPSULE | Freq: Every day | ORAL | 5 refills | Status: DC
Start: 2017-10-16 — End: 2017-11-25

## 2017-10-16 NOTE — Progress Notes (Signed)
Pt presents for ROB c/o iron tabs are making her vomit.

## 2017-10-16 NOTE — Progress Notes (Signed)
   PRENATAL VISIT NOTE  Subjective:  Gabrielle Huff is a 27 y.o. G1P0000 at 3379w0d being seen today for ongoing prenatal care.  She is currently monitored for the following issues for this low-risk pregnancy and has Supervision of normal first pregnancy, antepartum; History of heart murmur in childhood; Scoliosis; Family history of identical twins; Late prenatal care; Vitamin D deficiency; Anemia during pregnancy in second trimester; Chest pain; and SOB (shortness of breath) on their problem list.  Patient reports no complaints.  Contractions: Not present. Vag. Bleeding: None.  Movement: Present. Denies leaking of fluid.   The following portions of the patient's history were reviewed and updated as appropriate: allergies, current medications, past family history, past medical history, past social history, past surgical history and problem list. Problem list updated.  Objective:   Vitals:   10/16/17 1411  BP: 102/68  Pulse: 97  Weight: 110 lb 3.2 oz (50 kg)    Fetal Status: Fetal Heart Rate (bpm): 143; doppler Fundal Height: 24 cm Movement: Present     General:  Alert, oriented and cooperative. Patient is in no acute distress.  Skin: Skin is warm and dry. No rash noted.   Cardiovascular: Normal heart rate noted  Respiratory: Normal respiratory effort, no problems with respiration noted  Abdomen: Soft, gravid, appropriate for gestational age.  Pain/Pressure: Absent     Pelvic: Cervical exam deferred        Extremities: Normal range of motion.  Edema: None  Mental Status: Normal mood and affect. Normal behavior. Normal judgment and thought content.   Assessment and Plan:  Pregnancy: G1P0000 at 7079w0d  1. Supervision of normal first pregnancy, antepartum     Doing well  2. Vitamin D deficiency     Taking weekly vitamin D  3. Anemia during pregnancy in second trimester     Stable per patient report is eating iron rich foods, unable to keep Bloom down currently.   - Iron-FA-B  Cmp-C-Biot-Probiotic (FUSION PLUS) CAPS; Take 1 tablet by mouth daily.  Dispense: 30 capsule; Refill: 5  Preterm labor symptoms and general obstetric precautions including but not limited to vaginal bleeding, contractions, leaking of fluid and fetal movement were reviewed in detail with the patient. Please refer to After Visit Summary for other counseling recommendations.  Return in about 1 month (around 11/13/2017) for ROB, 2 hr OGTT.  Future Appointments  Date Time Provider Department Center  10/20/2017 10:30 AM Berton BonHammond, Janine, NP [facility]  11/13/2017  9:00 AM [facility] None  11/13/2017  9:15 AM Roe Coombsenney, Rachelle A, CNM [facility] None    Roe Coombsachelle A Denney, CNM

## 2017-10-16 NOTE — Patient Instructions (Signed)
Anemia Anemia is a condition in which you do not have enough red blood cells or hemoglobin. Hemoglobin is a substance in red blood cells that carries oxygen. When you do not have enough red blood cells or hemoglobin (are anemic), your body cannot get enough oxygen and your organs may not work properly. As a result, you may feel very tired or have other problems. What are the causes? Common causes of anemia include:  Excessive bleeding. Anemia can be caused by excessive bleeding inside or outside the body, including bleeding from the intestine or from periods in women.  Poor nutrition.  Long-lasting (chronic) kidney, thyroid, and liver disease.  Bone marrow disorders.  Cancer and treatments for cancer.  HIV (human immunodeficiency virus) and AIDS (acquired immunodeficiency syndrome).  Treatments for HIV and AIDS.  Spleen problems.  Blood disorders.  Infections, medicines, and autoimmune disorders that destroy red blood cells.  What are the signs or symptoms? Symptoms of this condition include:  Minor weakness.  Dizziness.  Headache.  Feeling heartbeats that are irregular or faster than normal (palpitations).  Shortness of breath, especially with exercise.  Paleness.  Cold sensitivity.  Indigestion.  Nausea.  Difficulty sleeping.  Difficulty concentrating.  Symptoms may occur suddenly or develop slowly. If your anemia is mild, you may not have symptoms. How is this diagnosed? This condition is diagnosed based on:  Blood tests.  Your medical history.  A physical exam.  Bone marrow biopsy.  Your health care provider may also check your stool (feces) for blood and may do additional testing to look for the cause of your bleeding. You may also have other tests, including:  Imaging tests, such as a CT scan or MRI.  Endoscopy.  Colonoscopy.  How is this treated? Treatment for this condition depends on the cause. If you continue to lose a lot of blood,  you may need to be treated at a hospital. Treatment may include:  Taking supplements of iron, vitamin B12, or folic acid.  Taking a hormone medicine (erythropoietin) that can help to stimulate red blood cell growth.  Having a blood transfusion. This may be needed if you lose a lot of blood.  Making changes to your diet.  Having surgery to remove your spleen.  Follow these instructions at home:  Take over-the-counter and prescription medicines only as told by your health care provider.  Take supplements only as told by your health care provider.  Follow any diet instructions that you were given.  Keep all follow-up visits as told by your health care provider. This is important. Contact a health care provider if:  You develop new bleeding anywhere in the body. Get help right away if:  You are very weak.  You are short of breath.  You have pain in your abdomen or chest.  You are dizzy or feel faint.  You have trouble concentrating.  You have bloody or black, tarry stools.  You vomit repeatedly or you vomit up blood. Summary  Anemia is a condition in which you do not have enough red blood cells or enough of a substance in your red blood cells that carries oxygen (hemoglobin).  Symptoms may occur suddenly or develop slowly.  If your anemia is mild, you may not have symptoms.  This condition is diagnosed with blood tests as well as a medical history and physical exam. Other tests may be needed.  Treatment for this condition depends on the cause of the anemia. This information is not intended to replace advice   given to you by your health care provider. Make sure you discuss any questions you have with your health care provider. Document Released: 05/16/2004 Document Revised: 05/10/2016 Document Reviewed: 05/10/2016 Elsevier Interactive Patient Education  2018 [employer]. Glucose Tolerance Test During Pregnancy The glucose tolerance test (GTT) is a blood test used to  determine if you have developed a type of diabetes during pregnancy (gestational diabetes). This is when your body does not properly process sugar (glucose) in the food you eat, resulting in high blood glucose levels. Typically, a GTT is done after you have had a 1-hour glucose test with results that indicate you possibly have gestational diabetes. It may also be done if:  You have a history of giving birth to very large babies or have experienced repeated fetal loss (stillbirth).  You have signs and symptoms of diabetes, such as: ? Changes in your vision. ? Tingling or numbness in your hands or feet. ? Changes in hunger, thirst, and urination not otherwise explained by your pregnancy.  The GTT lasts about 3 hours. You will be given a sugar-water solution to drink at the beginning of the test. You will have blood drawn before you drink the solution and then again 1, 2, and 3 hours after you drink it. You will not be allowed to eat or drink anything else during the test. You must remain at the testing location to make sure that your blood is drawn on time. You should also avoid exercising during the test, because exercise can alter test results. How do I prepare for this test? Eat normally for 3 days prior to the GTT test, including having plenty of carbohydrate-rich foods. Do not eat or drink anything except water during the final 12 hours before the test. In addition, your health care provider may ask you to stop taking certain medicines before the test. What do the results mean? It is your responsibility to obtain your test results. Ask the lab or department performing the test when and how you will get your results. Contact your health care provider to discuss any questions you have about your results. Range of Normal Values Ranges for normal values may vary among different labs and hospitals. You should always check with your health care provider after having lab work or other tests done to  discuss whether your values are considered within normal limits. Normal levels of blood glucose are as follows:  Fasting: less than 105 mg/dL.  1 hour after drinking the solution: less than 190 mg/dL.  2 hours after drinking the solution: less than 165 mg/dL.  3 hours after drinking the solution: less than 145 mg/dL.  Some substances can interfere with GTT results. These may include:  Blood pressure and heart failure medicines, including beta blockers, furosemide, and thiazides.  Anti-inflammatory medicines, including aspirin.  Nicotine.  Some psychiatric medicines.  Meaning of Results Outside Normal Value Ranges GTT test results that are above normal values may indicate a number of health problems, such as:  Gestational diabetes.  Acute stress response.  Cushing syndrome.  Tumors such as pheochromocytoma or glucagonoma.  Long-term kidney problems.  Pancreatitis.  Hyperthyroidism.  Current infection.  Discuss your test results with your health care provider. He or she will use the results to make a diagnosis and determine a treatment plan that is right for you. This information is not intended to replace advice given to you by your health care provider. Make sure you discuss any questions you have with your health care  provider. Document Released: 10/08/2011 Document Revised: 09/14/2015 Document Reviewed: 08/13/2013 Elsevier Interactive Patient Education  2018 [place] of Pregnancy The second trimester is from week 13 through week 28, month 4 through 6. This is often the time in pregnancy that you feel your best. Often times, morning sickness has lessened or quit. You may have more energy, and you may get hungry more often. Your unborn baby (fetus) is growing rapidly. At the end of the sixth month, he or she is about 9 inches long and weighs about 1 pounds. You will likely feel the baby move (quickening) between 18 and 20 weeks of  pregnancy. Follow these instructions at home:  Avoid all smoking, herbs, and alcohol. Avoid drugs not approved by your doctor.  Do not use any tobacco products, including cigarettes, chewing tobacco, and electronic cigarettes. If you need help quitting, ask your doctor. You may get counseling or other support to help you quit.  Only take medicine as told by your doctor. Some medicines are safe and some are not during pregnancy.  Exercise only as told by your doctor. Stop exercising if you start having cramps.  Eat regular, healthy meals.  Wear a good support bra if your breasts are tender.  Do not use hot tubs, steam rooms, or saunas.  Wear your seat belt when driving.  Avoid raw meat, uncooked cheese, and liter boxes and soil used by cats.  Take your prenatal vitamins.  Take 1500-2000 milligrams of calcium daily starting at the 20th week of pregnancy until you deliver your baby.  Try taking medicine that helps you poop (stool softener) as needed, and if your doctor approves. Eat more fiber by eating fresh fruit, vegetables, and whole grains. Drink enough fluids to keep your pee (urine) clear or pale yellow.  Take warm water baths (sitz baths) to soothe pain or discomfort caused by hemorrhoids. Use hemorrhoid cream if your doctor approves.  If you have puffy, bulging veins (varicose veins), wear support hose. Raise (elevate) your feet for 15 minutes, 3-4 times a day. Limit salt in your diet.  Avoid heavy lifting, wear low heals, and sit up straight.  Rest with your legs raised if you have leg cramps or low back pain.  Visit your dentist if you have not gone during your pregnancy. Use a soft toothbrush to brush your teeth. Be gentle when you floss.  You can have sex (intercourse) unless your doctor tells you not to.  Go to your doctor visits. Get help if:  You feel dizzy.  You have mild cramps or pressure in your lower belly (abdomen).  You have a nagging pain in your  belly area.  You continue to feel sick to your stomach (nauseous), throw up (vomit), or have watery poop (diarrhea).  You have bad smelling fluid coming from your vagina.  You have pain with peeing (urination). Get help right away if:  You have a fever.  You are leaking fluid from your vagina.  You have spotting or bleeding from your vagina.  You have severe belly cramping or pain.  You lose or gain weight rapidly.  You have trouble catching your breath and have chest pain.  You notice sudden or extreme puffiness (swelling) of your face, hands, ankles, feet, or legs.  You have not felt the baby move in over an hour.  You have severe headaches that do not go away with medicine.  You have vision changes. This information is not intended to replace advice  given to you by your health care provider. Make sure you discuss any questions you have with your health care provider. Document Released: 07/03/2009 Document Revised: 09/14/2015 Document Reviewed: 06/09/2012 Elsevier Interactive Patient Education  2017 [employer].

## 2017-10-20 ENCOUNTER — Ambulatory Visit: Payer: PRIVATE HEALTH INSURANCE | Admitting: Cardiology

## 2017-10-20 NOTE — Progress Notes (Deleted)
Cardiology Office Note:    Date:  10/20/2017   ID:  Gabrielle Huff, DOB 1990-10-03, MRN [id number]  PCP:  Patient, No Pcp Per  Cardiologist:  Tonny BollmanMichael Cooper, MD  Referring MD: Roe Coombsenney, Gabrielle Huff, CNM   No chief complaint on file. ***  History of Present Illness:    Gabrielle Huff is Huff 27 y.o. female with Huff past medical history significant for anemia and vitamin D deficiency. She was seen in the hospital in May at which time she was 5 months pregnant and experiencing periods of sinus tachycardia up to 120 bpm with heart palpitations and exertional dyspnea. She was found to be anemic with hemoglobin of 7.8 and severely low iron. She was transfused with one unit of pack and started on iron supplementation.Her troponins were negative and her echocardiogram was normal. CTA of the chest was negative for PE. An outpatient Holter monitor showed normal sinus rhythm with Huff few premature supraventricular beats but no ventricular ectopies, pathologic pauses or sustained arrhythmias. The average heart rate was 89 bpm.  She is here today for follow-up ---------------------------------------  Past Medical History:  Diagnosis Date  . Anemia   . Dysmenorrhea   . Heart murmur   . HSV-1 infection     Past Surgical History:  Procedure Laterality Date  . CYST REMOVAL HAND    . NO PAST SURGERIES      Current Medications: No outpatient medications have been marked as taking for the 10/20/17 encounter (Appointment) with Berton BonHammond, Colbe Viviano, NP.     Allergies:   Patient has no known allergies.   Social History   Socioeconomic History  . Marital status: Single    Spouse name: Not on file  . Number of children: Not on file  . Years of education: Not on file  . Highest education level: Not on file  Occupational History  . Not on file  Social Needs  . Financial resource strain: Not on file  . Food insecurity:    Worry: Not on file    Inability: Not on file  . Transportation needs:    Medical:  Not on file    Non-medical: Not on file  Tobacco Use  . Smoking status: Never Smoker  . Smokeless tobacco: Never Used  Substance and Sexual Activity  . Alcohol use: No  . Drug use: No  . Sexual activity: Yes    Partners: Male    Birth control/protection: Condom  Lifestyle  . Physical activity:    Days per week: Not on file    Minutes per session: Not on file  . Stress: Not on file  Relationships  . Social connections:    Talks on phone: Not on file    Gets together: Not on file    Attends religious service: Not on file    Active member of club or organization: Not on file    Attends meetings of clubs or organizations: Not on file    Relationship status: Not on file  Other Topics Concern  . Not on file  Social History Narrative  . Not on file     Family History: The patient's ***family history includes Heart attack in her maternal grandmother; Hypertension in her maternal grandmother and mother. ROS:   Please see the history of present illness.    *** All other systems reviewed and are negative.  EKGs/Labs/Other Studies Reviewed:    The following studies were reviewed today:  24-hour Holter monitor 10/10/2017 Study Highlights   The basic rhythm is  normal sinus There are Huff few premature supraventricular beats There are no ventricular ectopics, pathologic pauses, or sustained arrhythmias The average heart rate is 89 bpm   Echocardiogram 09/12/2017 Study Conclusions - Left ventricle: The cavity size was normal. Wall thickness was   normal. Systolic function was normal. The estimated ejection   fraction was in the range of 60% to 65%. Wall motion was normal;   there were no regional wall motion abnormalities. Left   ventricular diastolic function parameters were normal. - Left atrium: The atrium was normal in size. - Inferior vena cava: The vessel was normal in size. The   respirophasic diameter changes were in the normal range (>= 50%),   consistent with normal  central venous pressure.  Impressions: - Normal study.   EKG:  EKG is *** ordered today.  The ekg ordered today demonstrates ***  Recent Labs: 09/13/2017: Magnesium 1.6 09/18/2017: ALT 8; BUN 7; Creatinine, Ser 0.51; Hemoglobin 10.0; Platelets 250; Potassium 4.4; Sodium 139   Recent Lipid Panel No results found for: CHOL, TRIG, HDL, CHOLHDL, VLDL, LDLCALC, LDLDIRECT  Physical Exam:    VS:  LMP 05/01/2017     Wt Readings from Last 3 Encounters:  10/16/17 110 lb 3.2 oz (50 kg)  09/18/17 108 lb 11.2 oz (49.3 kg)  08/21/17 101 lb (45.8 kg)     Physical Exam***   ASSESSMENT:    No diagnosis found. PLAN:    In order of problems listed above:  1. ***   Medication Adjustments/Labs and Tests Ordered: Current medicines are reviewed at length with the patient today.  Concerns regarding medicines are outlined above. Labs and tests ordered and medication changes are outlined in the patient instructions below:  There are no Patient Instructions on file for this visit.   Signed, Berton Bon, NP  10/20/2017 5:54 AM    [facility]

## 2017-11-13 ENCOUNTER — Encounter: Payer: PRIVATE HEALTH INSURANCE | Admitting: Certified Nurse Midwife

## 2017-11-13 ENCOUNTER — Encounter: Payer: Self-pay | Admitting: Obstetrics and Gynecology

## 2017-11-13 ENCOUNTER — Ambulatory Visit (INDEPENDENT_AMBULATORY_CARE_PROVIDER_SITE_OTHER): Payer: PRIVATE HEALTH INSURANCE | Admitting: Obstetrics and Gynecology

## 2017-11-13 ENCOUNTER — Other Ambulatory Visit: Payer: PRIVATE HEALTH INSURANCE

## 2017-11-13 VITALS — BP 107/69 | HR 93 | Wt 116.2 lb

## 2017-11-13 DIAGNOSIS — Z3400 Encounter for supervision of normal first pregnancy, unspecified trimester: Secondary | ICD-10-CM

## 2017-11-13 NOTE — Patient Instructions (Signed)

## 2017-11-13 NOTE — Progress Notes (Signed)
   PRENATAL VISIT NOTE  Subjective:  Gabrielle Huff is a 27 y.o. G1P0000 at 3550w0d being seen today for ongoing prenatal care.  She is currently monitored for the following issues for this low-risk pregnancy and has Supervision of normal first pregnancy, antepartum; History of heart murmur in childhood; Scoliosis; Family history of identical twins; Late prenatal care; Vitamin D deficiency; Anemia during pregnancy in second trimester; Chest pain; and SOB (shortness of breath) on their problem list.  Patient reports occasional cramping. Has happened about once a week last two weeks, resolved on their own.  Contractions: Irregular. Vag. Bleeding: None.  Movement: Present. Denies leaking of fluid.   The following portions of the patient's history were reviewed and updated as appropriate: allergies, current medications, past family history, past medical history, past social history, past surgical history and problem list. Problem list updated.  Objective:   Vitals:   11/13/17 0940  BP: 107/69  Pulse: 93  Weight: 116 lb 3.2 oz (52.7 kg)    Fetal Status: Fetal Heart Rate (bpm): 145 Fundal Height: 27 cm Movement: Present     General:  Alert, oriented and cooperative. Patient is in no acute distress.  Skin: Skin is warm and dry. No rash noted.   Cardiovascular: Normal heart rate noted  Respiratory: Normal respiratory effort, no problems with respiration noted  Abdomen: Soft, gravid, appropriate for gestational age.  Pain/Pressure: Absent     Pelvic: Cervical exam deferred        Extremities: Normal range of motion.  Edema: None  Mental Status: Normal mood and affect. Normal behavior. Normal judgment and thought content.   Assessment and Plan:  Pregnancy: G1P0000 at 2150w0d  1. Supervision of normal first pregnancy, antepartum Declines Tdap today - Glucose Tolerance, 2 Hours w/1 Hour - CBC - HIV antibody - RPR  Preterm labor symptoms and general obstetric precautions including but not  limited to vaginal bleeding, contractions, leaking of fluid and fetal movement were reviewed in detail with the patient. Please refer to After Visit Summary for other counseling recommendations.  Return in about 2 weeks (around 11/27/2017) for OB visit.  No future appointments.  Conan BowensKelly M Davis, MD

## 2017-11-13 NOTE — Progress Notes (Signed)
Pt declines TDAP today.

## 2017-11-14 LAB — CBC
HEMATOCRIT: 35.2 % (ref 34.0–46.6)
Hemoglobin: 9.6 g/dL — ABNORMAL LOW (ref 11.1–15.9)
MCH: 19.3 pg — ABNORMAL LOW (ref 26.6–33.0)
MCHC: 27.3 g/dL — AB (ref 31.5–35.7)
MCV: 71 fL — ABNORMAL LOW (ref 79–97)
PLATELETS: 250 10*3/uL (ref 150–450)
RBC: 4.98 x10E6/uL (ref 3.77–5.28)
RDW: 17.8 % — AB (ref 12.3–15.4)
WBC: 12.2 10*3/uL — ABNORMAL HIGH (ref 3.4–10.8)

## 2017-11-14 LAB — GLUCOSE TOLERANCE, 2 HOURS W/ 1HR
GLUCOSE, 1 HOUR: 98 mg/dL (ref 65–179)
GLUCOSE, 2 HOUR: 87 mg/dL (ref 65–152)
GLUCOSE, FASTING: 76 mg/dL (ref 65–91)

## 2017-11-14 LAB — HIV ANTIBODY (ROUTINE TESTING W REFLEX): HIV SCREEN 4TH GENERATION: NONREACTIVE

## 2017-11-14 LAB — RPR: RPR: NONREACTIVE

## 2017-11-19 ENCOUNTER — Telehealth: Payer: Self-pay

## 2017-11-19 NOTE — Telephone Encounter (Signed)
Attempted to contact, no answer, left vm ?

## 2017-11-19 NOTE — Telephone Encounter (Signed)
S/w pt and advised of results and to increase iron.

## 2017-11-25 ENCOUNTER — Ambulatory Visit (INDEPENDENT_AMBULATORY_CARE_PROVIDER_SITE_OTHER): Payer: PRIVATE HEALTH INSURANCE | Admitting: Obstetrics and Gynecology

## 2017-11-25 ENCOUNTER — Encounter: Payer: Self-pay | Admitting: Obstetrics and Gynecology

## 2017-11-25 VITALS — BP 106/68 | HR 94 | Wt 116.0 lb

## 2017-11-25 DIAGNOSIS — O0933 Supervision of pregnancy with insufficient antenatal care, third trimester: Secondary | ICD-10-CM

## 2017-11-25 DIAGNOSIS — Z3400 Encounter for supervision of normal first pregnancy, unspecified trimester: Secondary | ICD-10-CM

## 2017-11-25 DIAGNOSIS — O0930 Supervision of pregnancy with insufficient antenatal care, unspecified trimester: Secondary | ICD-10-CM

## 2017-11-25 MED ORDER — FERROUS SULFATE 300 (60 FE) MG/5ML PO SYRP: 300.0000 mg | ORAL_SOLUTION | Freq: Every day | ORAL | Status: DC

## 2017-11-25 NOTE — Progress Notes (Signed)
   PRENATAL VISIT NOTE  Subjective:  Gabrielle Huff is a [age] y.o. G1P0000 at 7847w5d being seen today for ongoing prenatal care.  She is currently monitored for the following issues for this low-risk pregnancy and has Supervision of normal first pregnancy, antepartum; History of heart murmur in childhood; Scoliosis; Family history of identical twins; Late prenatal care; Vitamin D deficiency; Anemia during pregnancy in second trimester; Chest pain; and SOB (shortness of breath) on their problem list.  Patient reports vomiting after taking iron pills.  Contractions: Not present. Vag. Bleeding: None.  Movement: Present. Denies leaking of fluid.   The following portions of the patient's history were reviewed and updated as appropriate: allergies, current medications, past family history, past medical history, past social history, past surgical history and problem list. Problem list updated.  Objective:   Vitals:   11/25/17 1436  BP: 106/68  Pulse: 94  Weight: 116 lb (52.6 kg)   Fetal Status: Fetal Heart Rate (bpm): 132   Movement: Present     General:  Alert, oriented and cooperative. Patient is in no acute distress.  Skin: Skin is warm and dry. No rash noted.   Cardiovascular: Normal heart rate noted  Respiratory: Normal respiratory effort, no problems with respiration noted  Abdomen: Soft, gravid, appropriate for gestational age.  Pain/Pressure: Absent     Pelvic: Cervical exam deferred        Extremities: Normal range of motion.  Edema: None  Mental Status: Normal mood and affect. Normal behavior. Normal judgment and thought content.   Assessment and Plan:  Pregnancy: G1P0000 at 2347w5d  1. Supervision of normal first pregnancy, antepartum Will switch to liquid ferrous sulfate  2. Late prenatal care   Preterm labor symptoms and general obstetric precautions including but not limited to vaginal bleeding, contractions, leaking of fluid and fetal movement were reviewed in detail with the  patient. Please refer to After Visit Summary for other counseling recommendations.  Return in about 2 weeks (around 12/09/2017) for OB visit (MD).  No future appointments.  Conan BowensKelly M Lexia Vandevender, MD

## 2017-12-09 ENCOUNTER — Ambulatory Visit (INDEPENDENT_AMBULATORY_CARE_PROVIDER_SITE_OTHER): Payer: PRIVATE HEALTH INSURANCE | Admitting: Family Medicine

## 2017-12-09 VITALS — BP 107/66 | HR 88 | Wt 121.3 lb

## 2017-12-09 DIAGNOSIS — Z3403 Encounter for supervision of normal first pregnancy, third trimester: Secondary | ICD-10-CM

## 2017-12-09 DIAGNOSIS — Z3400 Encounter for supervision of normal first pregnancy, unspecified trimester: Secondary | ICD-10-CM

## 2017-12-09 NOTE — Progress Notes (Signed)
   PRENATAL VISIT NOTE  Subjective:  Gabrielle Huff is a 27 y.o. G1P0000 at 1044w5d being seen today for ongoing prenatal care.  She is currently monitored for the following issues for this low-risk pregnancy and has Supervision of normal first pregnancy, antepartum; History of heart murmur in childhood; Scoliosis; Family history of identical twins; Late prenatal care; Vitamin D deficiency; Anemia during pregnancy in second trimester; Chest pain; and SOB (shortness of breath) on their problem list.  Patient reports no complaints.  Contractions: Not present. Vag. Bleeding: None.  Movement: Present. Denies leaking of fluid.   The following portions of the patient's history were reviewed and updated as appropriate: allergies, current medications, past family history, past medical history, past social history, past surgical history and problem list. Problem list updated.  Objective:   Vitals:   12/09/17 1148  BP: 107/66  Pulse: 88  Weight: 121 lb 4.8 oz (55 kg)    Fetal Status: Fetal Heart Rate (bpm): 140 Fundal Height: 30 cm Movement: Present     General:  Alert, oriented and cooperative. Patient is in no acute distress.  Skin: Skin is warm and dry. No rash noted.   Cardiovascular: Normal heart rate noted  Respiratory: Normal respiratory effort, no problems with respiration noted  Abdomen: Soft, gravid, appropriate for gestational age.  Pain/Pressure: Absent     Pelvic: Cervical exam deferred        Extremities: Normal range of motion.  Edema: None  Mental Status: Normal mood and affect. Normal behavior. Normal judgment and thought content.   Assessment and Plan:  Pregnancy: G1P0000 at 3844w5d  1. Supervision of normal first pregnancy, antepartum Continue routine prenatal care.   Preterm labor symptoms and general obstetric precautions including but not limited to vaginal bleeding, contractions, leaking of fluid and fetal movement were reviewed in detail with the patient. Please refer  to After Visit Summary for other counseling recommendations.  Return in 2 weeks (on 12/23/2017).  Future Appointments  Date Time Provider Department Center  12/24/2017  9:30 AM Adam PhenixArnold, James G, MD [facility] None    Reva Boresanya S Verlon Carcione, MD

## 2017-12-09 NOTE — Patient Instructions (Signed)

## 2017-12-09 NOTE — Progress Notes (Signed)
Pt is G1P0 3736w5d here for ROB.

## 2017-12-16 DIAGNOSIS — O2230 Deep phlebothrombosis in pregnancy, unspecified trimester: Secondary | ICD-10-CM | POA: Insufficient documentation

## 2017-12-16 DIAGNOSIS — I82429 Acute embolism and thrombosis of unspecified iliac vein: Secondary | ICD-10-CM | POA: Insufficient documentation

## 2017-12-24 ENCOUNTER — Encounter: Payer: PRIVATE HEALTH INSURANCE | Admitting: Obstetrics & Gynecology

## 2018-03-26 ENCOUNTER — Emergency Department (HOSPITAL_COMMUNITY): Payer: PRIVATE HEALTH INSURANCE

## 2018-03-26 ENCOUNTER — Emergency Department (HOSPITAL_COMMUNITY)
Admission: EM | Admit: 2018-03-26 | Discharge: 2018-03-26 | Disposition: A | Discharge status: Home / Self Care | Payer: PRIVATE HEALTH INSURANCE | Attending: Emergency Medicine | Admitting: Emergency Medicine

## 2018-03-26 ENCOUNTER — Encounter (HOSPITAL_COMMUNITY): Payer: Self-pay

## 2018-03-26 ENCOUNTER — Other Ambulatory Visit: Payer: Self-pay

## 2018-03-26 DIAGNOSIS — J069 Acute upper respiratory infection, unspecified: Secondary | ICD-10-CM | POA: Insufficient documentation

## 2018-03-26 DIAGNOSIS — R070 Pain in throat: Secondary | ICD-10-CM | POA: Diagnosis present

## 2018-03-26 LAB — CBC WITH DIFFERENTIAL/PLATELET
Abs Immature Granulocytes: 0.01 10*3/uL (ref 0.00–0.07)
Basophils Absolute: 0 10*3/uL (ref 0.0–0.1)
Basophils Relative: 0 %
Eosinophils Absolute: 0.1 10*3/uL (ref 0.0–0.5)
Eosinophils Relative: 1 %
HCT: 34.8 % — ABNORMAL LOW (ref 36.0–46.0)
Hemoglobin: 9.6 g/dL — ABNORMAL LOW (ref 12.0–15.0)
IMMATURE GRANULOCYTES: 0 %
Lymphocytes Relative: 16 %
Lymphs Abs: 1 10*3/uL (ref 0.7–4.0)
MCH: 18.8 pg — ABNORMAL LOW (ref 26.0–34.0)
MCHC: 27.6 g/dL — ABNORMAL LOW (ref 30.0–36.0)
MCV: 68.1 fL — ABNORMAL LOW (ref 80.0–100.0)
Monocytes Absolute: 0.6 10*3/uL (ref 0.1–1.0)
Monocytes Relative: 9 %
NEUTROS PCT: 74 %
NRBC: 0 % (ref 0.0–0.2)
Neutro Abs: 4.8 10*3/uL (ref 1.7–7.7)
Platelets: 237 10*3/uL (ref 150–400)
RBC: 5.11 MIL/uL (ref 3.87–5.11)
RDW: 18.6 % — ABNORMAL HIGH (ref 11.5–15.5)
WBC: 6.4 10*3/uL (ref 4.0–10.5)

## 2018-03-26 LAB — BASIC METABOLIC PANEL
Anion gap: 7 (ref 5–15)
BUN: <5 mg/dL — ABNORMAL LOW (ref 6–20)
CALCIUM: 8.9 mg/dL (ref 8.9–10.3)
CO2: 25 mmol/L (ref 22–32)
Chloride: 106 mmol/L (ref 98–111)
Creatinine, Ser: 0.59 mg/dL (ref 0.44–1.00)
GFR calc non Af Amer: >60 mL/min (ref >60)
Glucose, Bld: 90 mg/dL (ref 70–99)
Potassium: 3.5 mmol/L (ref 3.5–5.1)
Sodium: 138 mmol/L (ref 135–145)

## 2018-03-26 LAB — I-STAT BETA HCG BLOOD, ED (MC, WL, AP ONLY): I-stat hCG, quantitative: <5 m[IU]/mL (ref <5)

## 2018-03-26 LAB — GROUP A STREP BY PCR: Group A Strep by PCR: NOT DETECTED

## 2018-03-26 MED ORDER — SODIUM CHLORIDE (PF) 0.9 % IJ SOLN
INTRAMUSCULAR | Status: AC
  Filled 2018-03-26: qty 50

## 2018-03-26 MED ORDER — IOPAMIDOL (ISOVUE-300) INJECTION 61%
INTRAVENOUS | Status: AC
  Filled 2018-03-26: qty 100

## 2018-03-26 MED ORDER — DEXAMETHASONE SODIUM PHOSPHATE 10 MG/ML IJ SOLN
10.0000 mg | Freq: Once | INTRAMUSCULAR | Status: AC
  Administered 2018-03-26: 10 mg via INTRAVENOUS
  Filled 2018-03-26: qty 1

## 2018-03-26 MED ORDER — IOPAMIDOL (ISOVUE-300) INJECTION 61%
100.0000 mL | Freq: Once | INTRAVENOUS | Status: AC | PRN
  Administered 2018-03-26: 100 mL via INTRAVENOUS

## 2018-03-26 MED ORDER — SODIUM CHLORIDE 0.9 % IV BOLUS
1000.0000 mL | Freq: Once | INTRAVENOUS | Status: AC
  Administered 2018-03-26: 1000 mL via INTRAVENOUS

## 2018-03-26 MED ORDER — PHENOL 1.4 % MT LIQD: 1.0000 | OROMUCOSAL | 0 refills | Status: DC | PRN

## 2018-03-26 NOTE — ED Triage Notes (Signed)
Pt states she has had a sore throat since Sunday. Pt states swallowing is painful, and is concerned for strep.

## 2018-03-26 NOTE — ED Provider Notes (Signed)
[facility]-EMERGENCY DEPT Provider Note   CSN: [id number] Arrival date & time: 03/26/18  1335     History   Chief Complaint Chief Complaint  Patient presents with  . Sore Throat    HPI Gabrielle Huff is a 27 y.o. femalepresenting for evaluation of URI symptoms.  Patient states for the past 5 days, she has been having sore throat, nasal congestion, cough.  Patient states her throat is gradually worsening.  She has been taking Tylenol and cold and flu medicine without improvement of symptoms.  Patient reports increased pain with swallowing and opening her mouth.  She states her voice sounds muffled.  She denies fevers at home.  She denies ear pain, eye pain, chest pain, shortness of breath, nausea, vomiting, abdominal pain, urinary symptoms, normal bowel movements.  Patient is currently staying at home with her 80-month-old son.  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   She denies sick contacts.  HPI  Past Medical History:  Diagnosis Date  . Anemia   . Dysmenorrhea   . Heart murmur   . HSV-1 infection     Patient Active Problem List   Diagnosis Date Noted  . SOB (shortness of breath)   . Chest pain 09/11/2017  . Vitamin D deficiency 08/25/2017  . Anemia during pregnancy in second trimester 08/25/2017  . Supervision of normal first pregnancy, antepartum 08/21/2017  . History of heart murmur in childhood 08/21/2017  . Scoliosis 08/21/2017  . Family history of identical twins 08/21/2017  . Late prenatal care 08/21/2017    Past Surgical History:  Procedure Laterality Date  . CYST REMOVAL HAND    . NO PAST SURGERIES       OB History    Gravida  1   Para  0   Term  0   Preterm  0   AB  0   Living  0     SAB  0   TAB  0   Ectopic  0   Multiple  0   Live Births               Home Medications    Prior to Admission medications   Medication Sig Start Date End Date Taking? Authorizing Provider    omeprazole (PRILOSEC) 20 MG capsule Take 1 capsule (20 mg total) by mouth 2 (two) times daily before a meal. 09/18/17   Denney, Rachelle A, CNM  phenol (CHLORASEPTIC) 1.4 % LIQD Use as directed 1 spray in the mouth or throat as needed for throat irritation / pain. 03/26/18   Jakorian Marengo, PA-C  Prenatal-DSS-FeCb-FeGl-FA (CITRANATAL BLOOM) 90-1 MG TABS Take 1 tablet by mouth daily. 08/25/17   Orvilla Cornwall A, CNM  Vitamin D, Ergocalciferol, (DRISDOL) 50000 units CAPS capsule Take 1 capsule (50,000 Units total) by mouth every 7 (seven) days. 08/25/17   Roe Coombs, CNM    Family History Family History  Problem Relation Age of Onset  . Hypertension Maternal Grandmother   . Heart attack Maternal Grandmother   . Hypertension Mother     Social History Social History   Tobacco Use  . Smoking status: Never Smoker  . Smokeless tobacco: Never Used  Substance Use Topics  . Alcohol use: No  . Drug use: No     Allergies   Patient has no known allergies.   Review of Systems Review of Systems  HENT: Positive for congestion, sore throat, trouble swallowing and voice change.  Respiratory: Positive for cough.   All other systems reviewed and are negative.    Physical Exam Updated Vital Signs BP 111/73 (BP Location: Right Arm)   Pulse 89   Temp 99.8 F (37.7 C) (Oral)   Resp 17   Ht 5\' 4"  (1.626 m)   Wt 49.9 kg   LMP 03/26/2018   SpO2 100%   Breastfeeding? Unknown   BMI 18.88 kg/m   Physical Exam  Constitutional: She is oriented to person, place, and time. She appears well-developed and well-nourished. No distress.  HENT:  Head: Normocephalic and atraumatic.  Right Ear: Tympanic membrane, external ear and ear canal normal.  Left Ear: Tympanic membrane, external ear and ear canal normal.  Nose: Mucosal edema present. Right sinus exhibits no maxillary sinus tenderness and no frontal sinus tenderness. Left sinus exhibits no maxillary sinus tenderness and no frontal  sinus tenderness.  Mouth/Throat: Uvula is midline and mucous membranes are normal. There is trismus in the jaw. No uvula swelling. Posterior oropharyngeal erythema present. Tonsils are 2+ on the right. Tonsils are 2+ on the left. No tonsillar exudate.  Patient has decreased opening of her jaw due to pain, but not complete trismus.  Exam limited due to pain.  No obvious uvular deviation.  Patient's voice is hoarse/deep.  TMs nonerythematous nonbulging bilaterally.  Nasal mucosal edema noted.  Eyes: Pupils are equal, round, and reactive to light. Conjunctivae and EOM are normal.  Neck: Normal range of motion.  Cardiovascular: Regular rhythm and intact distal pulses.  Mildly tachycardic  Pulmonary/Chest: Effort normal and breath sounds normal. She has no decreased breath sounds. She has no wheezes. She has no rhonchi. She has no rales.  Pt speaking in full sentences without difficulty.  Clear lung sounds in all fields  Abdominal: Soft. She exhibits no distension. There is no tenderness.  Musculoskeletal: Normal range of motion.  Lymphadenopathy:    She has no cervical adenopathy.  Neurological: She is alert and oriented to person, place, and time.  Skin: Skin is warm. Capillary refill takes less than 2 seconds.  Psychiatric: She has a normal mood and affect.  Nursing note and vitals reviewed.    ED Treatments / Results  Labs (all labs ordered are listed, but only abnormal results are displayed) Labs Reviewed  CBC WITH DIFFERENTIAL/PLATELET - Abnormal; Notable for the following components:      Result Value   Hemoglobin 9.6 (*)    HCT 34.8 (*)    MCV 68.1 (*)    MCH 18.8 (*)    MCHC 27.6 (*)    RDW 18.6 (*)    All other components within normal limits  BASIC METABOLIC PANEL - Abnormal; Notable for the following components:   BUN <5 (*)    All other components within normal limits  GROUP A STREP BY PCR  I-STAT BETA HCG BLOOD, ED ([facility], [facility], [facility] ONLY)    EKG None  Radiology Ct Soft  Tissue Neck W Contrast  Result Date: 03/26/2018 CLINICAL DATA:  Sore throat with painful swallowing. EXAM: CT NECK WITH CONTRAST TECHNIQUE: Multidetector CT imaging of the neck was performed using the standard protocol following the bolus administration of intravenous contrast. CONTRAST:  100mL ISOVUE-300 IOPAMIDOL (ISOVUE-300) INJECTION 61% COMPARISON:  None. FINDINGS: Pharynx and larynx: Symmetric pharyngeal soft tissues without a mass identified. No epiglottic swelling. No peritonsillar or retropharyngeal fluid collection. Widely patent airway. Salivary glands: No inflammation, mass, or stone. Thyroid: Unremarkable. Lymph nodes: No enlarged or suspicious lymph nodes in the  neck. Vascular: Major vascular structures of the neck are patent. Limited intracranial: Unremarkable. Visualized orbits: Unremarkable. Mastoids and visualized paranasal sinuses: Minimal scattered mucosal thickening in the paranasal sinuses without fluid. Clear mastoid air cells. Skeleton: No acute osseous abnormality or suspicious osseous lesion. Upper chest: Clear lung apices. Other: None. IMPRESSION: Negative neck CT. Electronically Signed   By: Sebastian Ache M.D.   On: 03/26/2018 16:55    Procedures Procedures (including critical care time)  Medications Ordered in ED Medications  iopamidol (ISOVUE-300) 61 % injection (has no administration in time range)  sodium chloride (PF) 0.9 % injection (has no administration in time range)  dexamethasone (DECADRON) injection 10 mg (10 mg Intravenous Given 03/26/18 1521)  sodium chloride 0.9 % bolus 1,000 mL (0 mLs Intravenous Stopped 03/26/18 1551)  iopamidol (ISOVUE-300) 61 % injection 100 mL (100 mLs Intravenous Contrast Given 03/26/18 1630)     Initial Impression / Assessment and Plan / ED Course  I have reviewed the triage vital signs and the nursing notes.  Pertinent labs & imaging results that were available during my care of the patient were reviewed by me and considered in my  medical decision making (see chart for details).     Patient presenting for evaluation of URI symptoms.  Physical exam shows patient who is tachycardic with a low-grade fever.  Oral exam concerning, patient with hoarse/muffled voice and difficulty opening her mouth.  However, no obvious uvular deviation.  Strep negative.  Discussed with attending, Dr. Rodena Medin agrees to plan.  Will obtain basic labs and CT neck to rule out peritonsillar abscess.  Fluid and Decadron given for symptom control  Labs reassuring, no leukocytosis.  CT negative for peritonsillar abscess or concerning findings.  On reevaluation, patient reports symptoms are improved with fluids and Decadron.  Discussed that this is likely a viral illness, and will need to be treated symptomatically.  Discussed return if symptoms worsen.  At this time, patient appears safe for discharge.  Return precautions given.  Patient statess he understands and agrees to plan.   Final Clinical Impressions(s) / ED Diagnoses   Final diagnoses:  Upper respiratory tract infection, unspecified type    ED Discharge Orders         Ordered    phenol (CHLORASEPTIC) 1.4 % LIQD  As needed     03/26/18 1709           Alveria Apley, PA-C 03/26/18 1741    Wynetta Fines, MD 03/27/18 [phone]

## 2018-03-26 NOTE — ED Notes (Signed)
Bed: WTR5 Expected date:  Expected time:  Means of arrival:  Comments: 

## 2018-03-26 NOTE — Discharge Instructions (Addendum)
You likely have a viral illness.  This should be treated symptomatically. Use Tylenol or ibuprofen as needed for fevers or body aches. Use cough drops and syrups as needed. Use sore throat spray as needed.  Make sure you stay well-hydrated with water. Wash your hands frequently to prevent spread of infection. Follow-up with your primary care doctor in 1 week if your symptoms are not improving. Return to the emergency room if you develop chest pain, difficulty breathing, or any new or worsening symptoms.

## 2019-04-22 IMAGING — CT CT NECK W/ CM
3 of 4 series · 12 of 33 positions shown, 14 images · IV contrast (iopamidol)
Comparison: None.

CLINICAL DATA: Sore throat with painful swallowing.

EXAM:
CT NECK WITH CONTRAST
TECHNIQUE: Multidetector CT imaging of the neck was performed using the
standard protocol following the bolus administration of intravenous
contrast.
CONTRAST:  100mL 106IB2-CDD IOPAMIDOL (106IB2-CDD) INJECTION 61%

[Series 2: axial neck · axial · 0.34mm/px · z∈[-206,-48]mm · 4 of 119 slices shown, 5 images]
[im 20/119  soft-tissue]
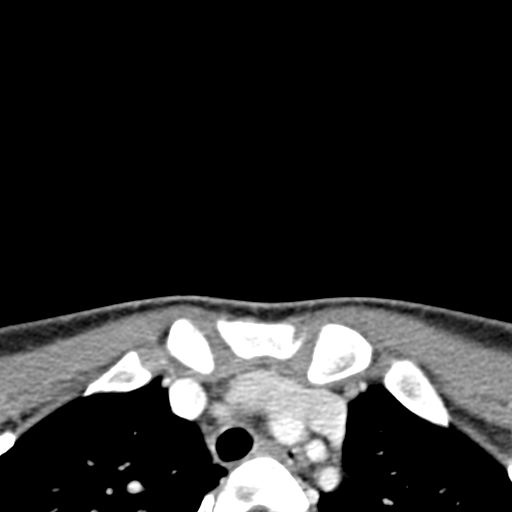
[im 20/119  bone]
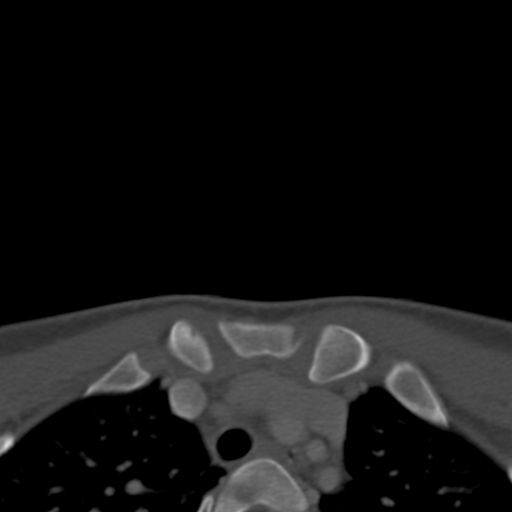
[im 40/119  bone]
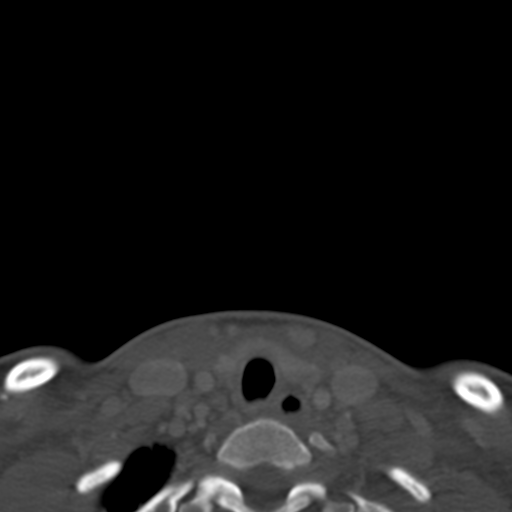
[im 79/119  bone]
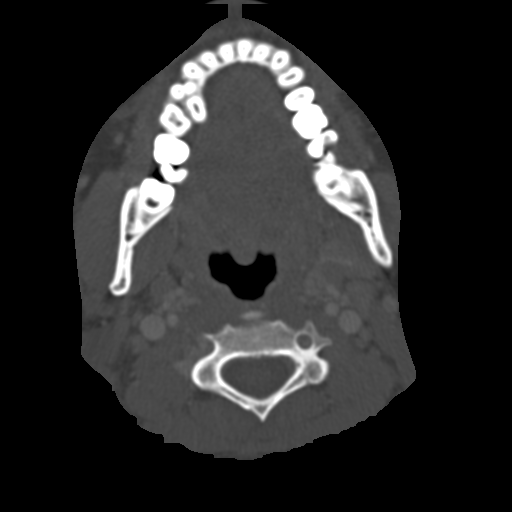
[im 99/119  bone]
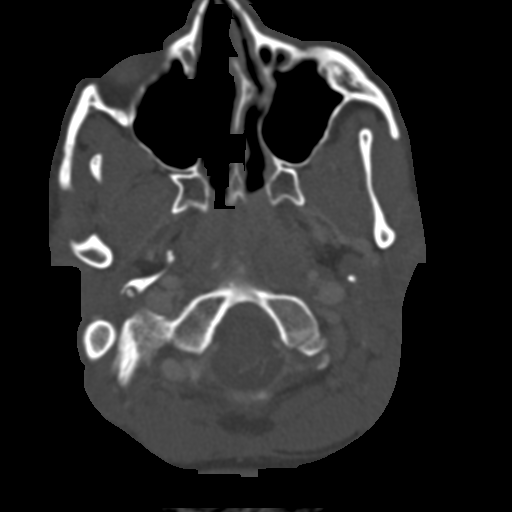

[Series 7: cor neck · coronal · 0.29mm/px · 3 of 82 slices shown]
[im 23/82  bone]
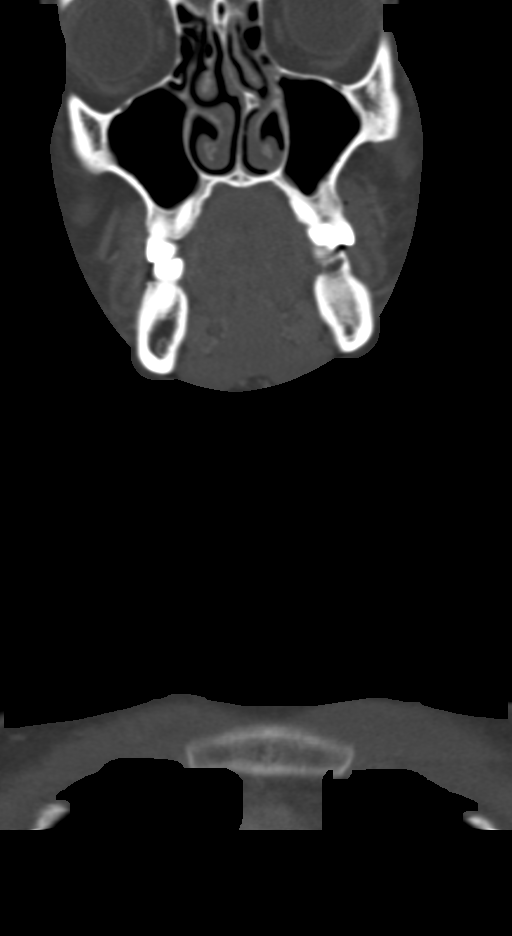
[im 35/82  bone]
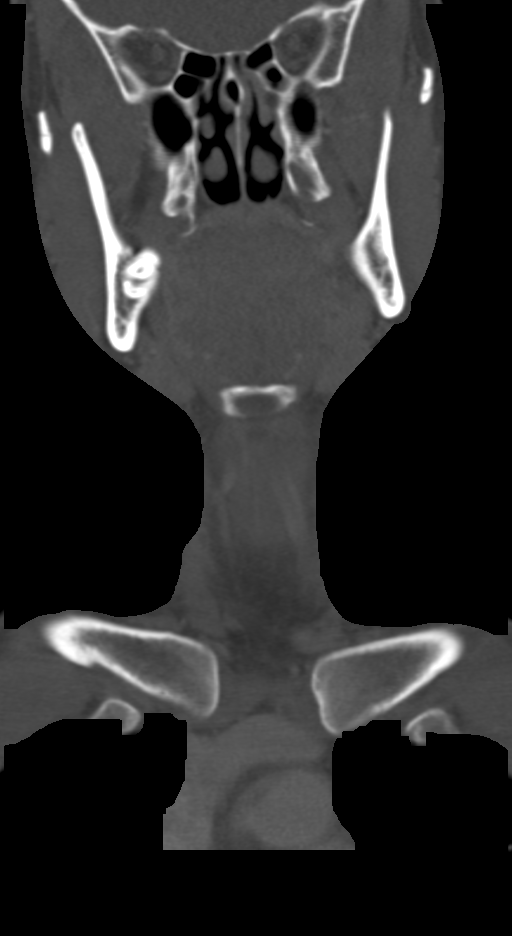
[im 47/82  bone]
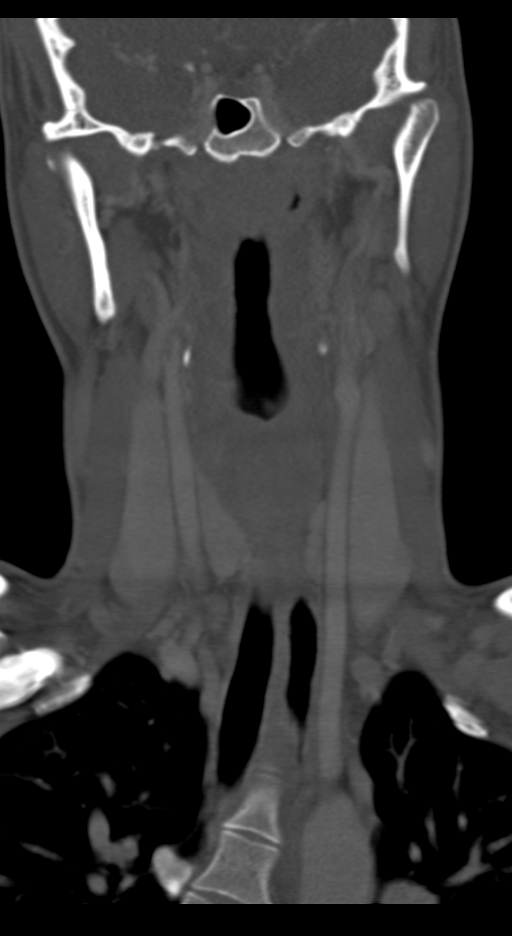

[Series 8: sag neck · sagittal · 0.33mm/px · 5 of 73 slices shown, 6 images]
[im 25/73  bone]
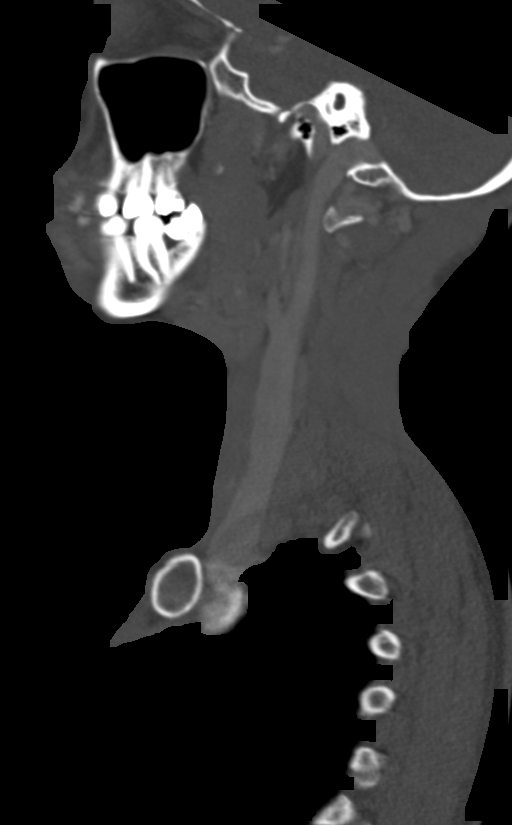
[im 31/73  bone]
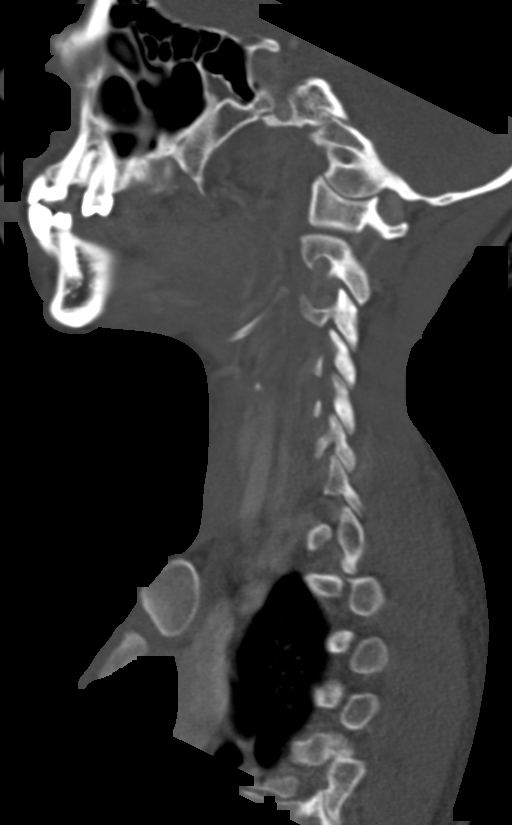
[im 37/73  soft-tissue]
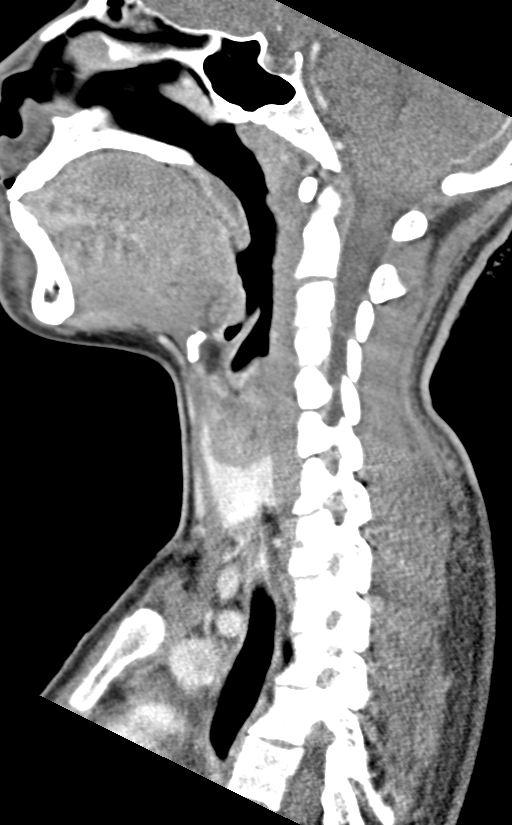
[im 37/73  bone]
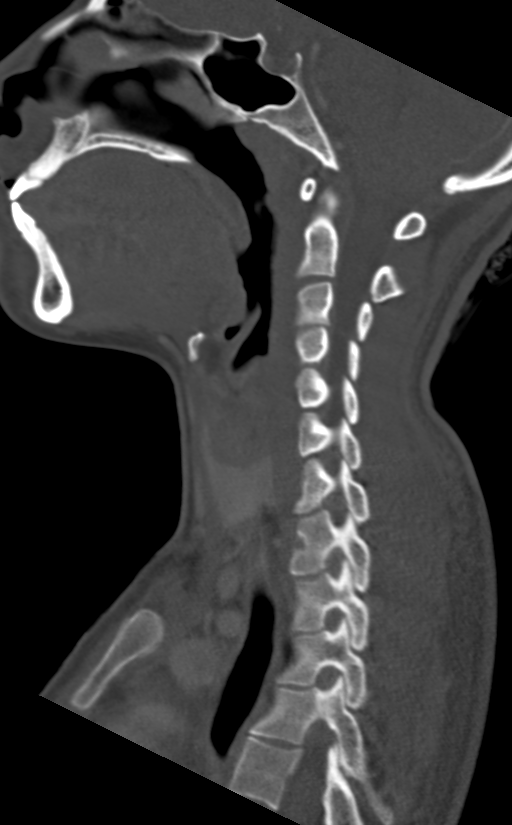
[im 43/73  bone]
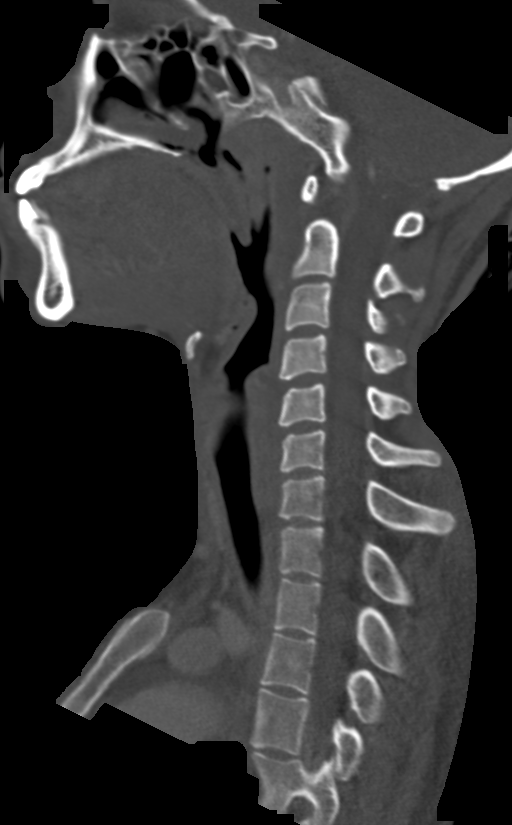
[im 49/73  bone]
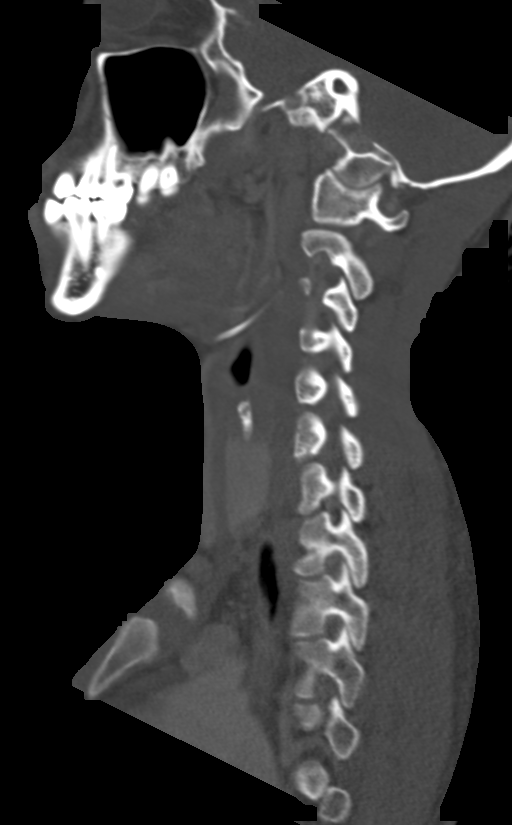

[12 of 33 positions shown; findings below may reference images not displayed]

FINDINGS: Pharynx and larynx: Symmetric pharyngeal soft tissues without a mass
identified. No epiglottic swelling. No peritonsillar or
retropharyngeal fluid collection. Widely patent airway.

Salivary glands: No inflammation, mass, or stone.

Thyroid: Unremarkable.

Lymph nodes: No enlarged or suspicious lymph nodes in the neck.

Vascular: Major vascular structures of the neck are patent.

Limited intracranial: Unremarkable.

Visualized orbits: Unremarkable.

Mastoids and visualized paranasal sinuses: Minimal scattered mucosal
thickening in the paranasal sinuses without fluid. Clear mastoid air
cells.

Skeleton: No acute osseous abnormality or suspicious osseous lesion.

Upper chest: Clear lung apices.

Other: None.
IMPRESSION: Negative neck CT.

## 2019-07-14 ENCOUNTER — Encounter: Payer: Self-pay | Admitting: Certified Nurse Midwife

## 2022-09-18 ENCOUNTER — Ambulatory Visit: Payer: PRIVATE HEALTH INSURANCE | Admitting: Family Medicine

## 2022-10-28 ENCOUNTER — Ambulatory Visit: Payer: PRIVATE HEALTH INSURANCE | Admitting: Family Medicine

## 2022-11-19 ENCOUNTER — Ambulatory Visit: Payer: Medicaid Other | Admitting: Radiology

## 2022-11-19 ENCOUNTER — Other Ambulatory Visit (HOSPITAL_COMMUNITY)
Admission: RE | Admit: 2022-11-19 | Discharge: 2022-11-19 | Disposition: A | Discharge status: Home / Self Care | Payer: Medicaid Other | Source: Ambulatory Visit | Attending: Radiology | Admitting: Radiology

## 2022-11-19 ENCOUNTER — Encounter: Payer: Self-pay | Admitting: Radiology

## 2022-11-19 VITALS — BP 112/82 | HR 95 | Ht 64.25 in | Wt 105.0 lb

## 2022-11-19 DIAGNOSIS — Z113 Encounter for screening for infections with a predominantly sexual mode of transmission: Secondary | ICD-10-CM

## 2022-11-19 DIAGNOSIS — Z01419 Encounter for gynecological examination (general) (routine) without abnormal findings: Secondary | ICD-10-CM | POA: Diagnosis present

## 2022-11-19 NOTE — Progress Notes (Signed)
   Gabrielle Huff 01/09/1991 [id number]   History:  32 y.o. G1P1 presents for annual exam.No gyn concerns. IUD placed 2019. Would like STI screen with pap.  Gynecologic History Patient's last menstrual period was 10/28/2022 (approximate).   Contraception/Family planning: IUD Sexually active: yes Last Pap: 2019. Results were: normal   Obstetric History OB History  Gravida Para Term Preterm AB Living  1 1 1  0 0 1  SAB IAB Ectopic Multiple Live Births  0 0 0 0      # Outcome Date GA Lbr Len/2nd Weight Sex Type Anes PTL Lv  1 Term              The following portions of the patient's history were reviewed and updated as appropriate: allergies, current medications, past family history, past medical history, past social history, past surgical history, and problem list.  Review of Systems Pertinent items noted in HPI and remainder of comprehensive ROS otherwise negative.   Past medical history, past surgical history, family history and social history were all reviewed and documented in the EPIC chart.   Exam:  Vitals:   11/19/22 1305  BP: 112/82  Pulse: 95  SpO2: 99%  Weight: 105 lb (47.6 kg)  Height: 5' 4.25" (1.632 m)   Body mass index is 17.88 kg/m.  General appearance:  Normal Thyroid:  Symmetrical, normal in size, without palpable masses or nodularity. Respiratory  Auscultation:  Clear without wheezing or rhonchi Cardiovascular  Auscultation:  Regular rate, without rubs, murmurs or gallops  Edema/varicosities:  Not grossly evident Abdominal  Soft,nontender, without masses, guarding or rebound.  Liver/spleen:  No organomegaly noted  Hernia:  None appreciated  Skin  Inspection:  Grossly normal Breasts: Examined lying and sitting.   Right: Without masses, retractions, nipple discharge or axillary adenopathy.   Left: Without masses, retractions, nipple discharge or axillary adenopathy. Genitourinary   Inguinal/mons:  Normal without inguinal  adenopathy  External genitalia:  Normal appearing vulva with no masses, tenderness, or lesions  BUS/Urethra/Skene's glands:  Normal without masses or exudate  Vagina:  Normal appearing with normal color and discharge, no lesions  Cervix:  Normal appearing without discharge or lesions. IUD strings seen  Uterus:  Normal in size, shape and contour.  Mobile, nontender  Adnexa/parametria:     Rt: Normal in size, without masses or tenderness.   Lt: Normal in size, without masses or tenderness.  Anus and perineum: Normal   Gabrielle Huff, CMA present for exam  Assessment/Plan:   1. Well woman exam with routine gynecological exam - Cytology - PAP( Gabrielle Huff)  2. Screening for STDs (sexually transmitted diseases) - Cytology - PAP( Gabrielle Huff)     Discussed SBE, pap and STI screening as directed/appropriate. Recommend of exercise weekly, including weight bearing exercise. Encouraged the use of seatbelts and sunscreen. Return in 1 year for annual or as needed.   Gabrielle Huff B WHNP-BC 1:22 PM 11/19/2022

## 2022-11-22 ENCOUNTER — Other Ambulatory Visit: Payer: Self-pay

## 2022-11-22 DIAGNOSIS — B379 Candidiasis, unspecified: Secondary | ICD-10-CM

## 2022-11-22 MED ORDER — FLUCONAZOLE 150 MG PO TABS
150.0000 mg | ORAL_TABLET | Freq: Once | ORAL | 0 refills | Status: AC
Start: 2022-11-22 — End: 2022-11-22

## 2022-11-25 ENCOUNTER — Ambulatory Visit: Payer: PRIVATE HEALTH INSURANCE | Admitting: Family Medicine

## 2022-12-05 ENCOUNTER — Ambulatory Visit: Payer: Medicaid Other | Admitting: Family Medicine

## 2022-12-05 VITALS — BP 98/70 | HR 89 | Temp 97.8°F | Ht 64.0 in | Wt 101.0 lb

## 2022-12-05 DIAGNOSIS — R636 Underweight: Secondary | ICD-10-CM | POA: Insufficient documentation

## 2022-12-05 DIAGNOSIS — Z862 Personal history of diseases of the blood and blood-forming organs and certain disorders involving the immune mechanism: Secondary | ICD-10-CM | POA: Diagnosis not present

## 2022-12-05 DIAGNOSIS — Z0000 Encounter for general adult medical examination without abnormal findings: Secondary | ICD-10-CM | POA: Insufficient documentation

## 2022-12-05 DIAGNOSIS — Z0001 Encounter for general adult medical examination with abnormal findings: Secondary | ICD-10-CM | POA: Diagnosis not present

## 2022-12-05 DIAGNOSIS — Z1329 Encounter for screening for other suspected endocrine disorder: Secondary | ICD-10-CM | POA: Insufficient documentation

## 2022-12-05 NOTE — Progress Notes (Signed)
New Patient Office Visit  Subjective    Patient ID: Gabrielle Huff, female    DOB: 23-Sep-1990  Age: 32 y.o. MRN: [id number]  CC:  Chief Complaint  Patient presents with   Establish Care    HPI Gabrielle Huff presents to establish care. Oriented to practice routines and expectations. PMH includes murmur, palpitations and DVTs while pregnant, and scoliosis. She does see an OBGYN. No concerns today.  Cervical CA screening: approximate date last month and was normal Tobacco: non-smoker STI: declines Vaccines:  UTD    No outpatient encounter medications on file as of 12/05/2022.   No facility-administered encounter medications on file as of 12/05/2022.    Past Medical History:  Diagnosis Date   Anemia    DVT (deep vein thrombosis) in pregnancy    Dysmenorrhea    Heart murmur    HSV-1 infection     Past Surgical History:  Procedure Laterality Date   CYST REMOVAL HAND     NO PAST SURGERIES      Family History  Problem Relation Age of Onset   Hypertension Maternal Grandmother    Heart attack Maternal Grandmother    Hypertension Mother     Social History   Socioeconomic History   Marital status: Single    Spouse name: Not on file   Number of children: Not on file   Years of education: Not on file   Highest education level: Some college, no degree  Occupational History   Not on file  Tobacco Use   Smoking status: Never   Smokeless tobacco: Never  Vaping Use   Vaping status: Never Used  Substance and Sexual Activity   Alcohol use: Yes    Comment: occassional   Drug use: No   Sexual activity: Yes    Partners: Male    Birth control/protection: I.U.D.    Comment: Mirena insertion on 01/20/2018, No hx of STIs, Menarche @ [age], First IC @ 32y/o  Other Topics Concern   Not on file  Social History Narrative   Not on file   Social Determinants of Health   Financial Resource Strain: Low Risk  (12/05/2022)   Overall Financial Resource Strain (CARDIA)     Difficulty of Paying Living Expenses: Not hard at all  Food Insecurity: No Food Insecurity (12/05/2022)   Hunger Vital Sign    Worried About Running Out of Food in the Last Year: Never true    Ran Out of Food in the Last Year: Never true  Transportation Needs: No Transportation Needs (12/05/2022)   PRAPARE - Administrator, Civil Service (Medical): No    Lack of Transportation (Non-Medical): No  Physical Activity: Insufficiently Active (12/05/2022)   Exercise Vital Sign    Days of Exercise per Week: 3 days    Minutes of Exercise per Session: 20 min  Stress: No Stress Concern Present (12/05/2022)   [employer] of Occupational Health - Occupational Stress Questionnaire    Feeling of Stress : Not at all  Social Connections: Moderately Isolated (12/05/2022)   Social Connection and Isolation Panel [NHANES]    Frequency of Communication with Friends and Family: More than three times a week    Frequency of Social Gatherings with Friends and Family: Once a week    Attends Religious Services: Never    Database administrator or Organizations: No    Attends Engineer, structural: Not on file    Marital Status: Living with partner  Intimate Partner Violence: Not  At Risk (12/08/2019)   [facility]   Humiliation, Afraid, Rape, and Kick questionnaire    Fear of Current or Ex-Partner: No    Emotionally Abused: No    Physically Abused: No    Sexually Abused: No    Review of Systems  Constitutional: Negative.   HENT: Negative.    Eyes: Negative.   Respiratory: Negative.    Cardiovascular: Negative.   Gastrointestinal: Negative.   Genitourinary: Negative.   Musculoskeletal: Negative.   Skin: Negative.   Neurological: Negative.   Endo/Heme/Allergies: Negative.   Psychiatric/Behavioral: Negative.    All other systems reviewed and are negative.       Objective    BP 98/70   Pulse 89   Temp 97.8  F (36.6 C) (Oral)   Ht 5\' 4"  (1.626 m)   Wt 101 lb (45.8 kg)   LMP 12/02/2022   SpO2 98%   BMI 17.34 kg/m   Physical Exam Vitals and nursing note reviewed.  Constitutional:      Appearance: Normal appearance. She is normal weight.  HENT:     Head: Normocephalic and atraumatic.     Right Ear: Tympanic membrane, ear canal and external ear normal.     Left Ear: Tympanic membrane, ear canal and external ear normal.     Nose: Nose normal.     Mouth/Throat:     Mouth: Mucous membranes are moist.     Pharynx: Oropharynx is clear.  Eyes:     Extraocular Movements: Extraocular movements intact.     Conjunctiva/sclera: Conjunctivae normal.     Pupils: Pupils are equal, round, and reactive to light.  Cardiovascular:     Rate and Rhythm: Normal rate and regular rhythm.     Pulses: Normal pulses.     Heart sounds: Normal heart sounds.  Pulmonary:     Effort: Pulmonary effort is normal.     Breath sounds: Normal breath sounds.  Abdominal:     General: Bowel sounds are normal.     Palpations: Abdomen is soft.  Musculoskeletal:        General: Normal range of motion.     Cervical back: Normal range of motion and neck supple.     Thoracic back: Scoliosis present.     Lumbar back: Scoliosis present.  Skin:    General: Skin is warm and dry.     Capillary Refill: Capillary refill takes less than 2 seconds.  Neurological:     General: No focal deficit present.     Mental Status: She is alert and oriented to person, place, and time. Mental status is at baseline.  Psychiatric:        Mood and Affect: Mood normal.        Behavior: Behavior normal.        Thought Content: Thought content normal.        Judgment: Judgment normal.         Assessment & Plan:   Problem List Items Addressed This Visit     Physical exam, annual    Today your medical history was reviewed and routine physical exam with labs was performed. Recommend 150 minutes of moderate intensity exercise weekly and  consuming a well-balanced diet. Advised to stop smoking if a smoker, avoid smoking if a non-smoker, limit alcohol consumption to 1 drink per day for women and 2 drinks per day for men, and avoid illicit drug use. Counseled on safe sex practices  and offered STI testing today. Counseled on the importance of sunscreen use. Counseled in mental health awareness and when to seek medical care. Vaccine maintenance discussed. Appropriate health maintenance items reviewed. Return to office in 1 year for annual physical exam.       Screening for thyroid disorder   Relevant Orders   TSH   History of anemia   Relevant Orders   CBC with Differential/Platelet   Underweight due to inadequate caloric intake - Primary   Relevant Orders   CBC with Differential/Platelet   COMPLETE METABOLIC PANEL WITH GFR   Lipid panel   TSH    Return in about 1 year (around 12/05/2023) for annual physical with labs 1 week prior.   Park Meo, FNP

## 2022-12-05 NOTE — Patient Instructions (Signed)
It was great to meet you today and I'm excited to have you join the [facility] practice. I hope you had a positive experience today! If you feel so inclined, please feel free to recommend our practice to friends and family. Mila Merry, FNP-C

## 2022-12-05 NOTE — Assessment & Plan Note (Signed)

## 2022-12-06 LAB — CBC WITH DIFFERENTIAL/PLATELET
Absolute Monocytes: 346 {cells}/uL (ref 200–950)
Basophils Absolute: 10 {cells}/uL (ref 0–200)
Basophils Relative: 0.3 %
Eosinophils Absolute: 80 {cells}/uL (ref 15–500)
Eosinophils Relative: 2.5 %
HCT: 39.9 % (ref 35.0–45.0)
Hemoglobin: 11.6 g/dL — ABNORMAL LOW (ref 11.7–15.5)
Lymphs Abs: 995 {cells}/uL (ref 850–3900)
MCH: 21.5 pg — ABNORMAL LOW (ref 27.0–33.0)
MCHC: 29.1 g/dL — ABNORMAL LOW (ref 32.0–36.0)
MCV: 74 fL — ABNORMAL LOW (ref 80.0–100.0)
MPV: 11.9 fL (ref 7.5–12.5)
Monocytes Relative: 10.8 %
Neutro Abs: 1770 {cells}/uL (ref 1500–7800)
Neutrophils Relative %: 55.3 %
Platelets: 295 10*3/uL (ref 140–400)
RBC: 5.39 10*6/uL — ABNORMAL HIGH (ref 3.80–5.10)
RDW: 12.7 % (ref 11.0–15.0)
Total Lymphocyte: 31.1 %
WBC: 3.2 10*3/uL — ABNORMAL LOW (ref 3.8–10.8)

## 2022-12-06 LAB — LIPID PANEL
Cholesterol: 126 mg/dL (ref <200)
HDL: 51 mg/dL (ref >=50)
LDL Cholesterol (Calc): 61 mg/dL
Non-HDL Cholesterol (Calc): 75 mg/dL (ref <130)
Total CHOL/HDL Ratio: 2.5 (calc) (ref <5.0)
Triglycerides: 63 mg/dL (ref <150)

## 2022-12-06 LAB — COMPLETE METABOLIC PANEL WITH GFR
AG Ratio: 1.7 (calc) (ref 1.0–2.5)
ALT: 7 U/L (ref 6–29)
AST: 14 U/L (ref 10–30)
Albumin: 4.5 g/dL (ref 3.6–5.1)
Alkaline phosphatase (APISO): 48 U/L (ref 31–125)
BUN: 9 mg/dL (ref 7–25)
CO2: 25 mmol/L (ref 20–32)
Calcium: 9.6 mg/dL (ref 8.6–10.2)
Chloride: 104 mmol/L (ref 98–110)
Creat: 0.56 mg/dL (ref 0.50–0.96)
Globulin: 2.7 g/dL (ref 1.9–3.7)
Glucose, Bld: 81 mg/dL (ref 65–99)
Potassium: 4.1 mmol/L (ref 3.5–5.3)
Sodium: 138 mmol/L (ref 135–146)
Total Bilirubin: 1.1 mg/dL (ref 0.2–1.2)
Total Protein: 7.2 g/dL (ref 6.1–8.1)
eGFR: 127 mL/min/{1.73_m2} (ref >=60)

## 2022-12-06 LAB — TSH: TSH: 1.81 m[IU]/L

## 2022-12-16 ENCOUNTER — Emergency Department (HOSPITAL_COMMUNITY): Payer: Medicaid Other

## 2022-12-16 ENCOUNTER — Emergency Department (HOSPITAL_COMMUNITY)
Admission: EM | Admit: 2022-12-16 | Discharge: 2022-12-17 | Disposition: A | Discharge status: Home / Self Care | Payer: Medicaid Other | Attending: Emergency Medicine | Admitting: Emergency Medicine

## 2022-12-16 ENCOUNTER — Other Ambulatory Visit: Payer: Self-pay

## 2022-12-16 ENCOUNTER — Encounter (HOSPITAL_COMMUNITY): Payer: Self-pay

## 2022-12-16 DIAGNOSIS — M25511 Pain in right shoulder: Secondary | ICD-10-CM | POA: Insufficient documentation

## 2022-12-16 DIAGNOSIS — R079 Chest pain, unspecified: Secondary | ICD-10-CM | POA: Diagnosis present

## 2022-12-16 DIAGNOSIS — U071 COVID-19: Secondary | ICD-10-CM | POA: Diagnosis not present

## 2022-12-16 DIAGNOSIS — M546 Pain in thoracic spine: Secondary | ICD-10-CM | POA: Insufficient documentation

## 2022-12-16 LAB — RESP PANEL BY RT-PCR (RSV, FLU A&B, COVID)  RVPGX2
Influenza A by PCR: NEGATIVE
Influenza B by PCR: NEGATIVE
Resp Syncytial Virus by PCR: NEGATIVE
SARS Coronavirus 2 by RT PCR: POSITIVE — AB

## 2022-12-16 MED ORDER — ACETAMINOPHEN 325 MG PO TABS
650.0000 mg | ORAL_TABLET | Freq: Once | ORAL | Status: AC
  Administered 2022-12-17: 650 mg via ORAL
  Filled 2022-12-16: qty 2

## 2022-12-16 MED ORDER — ALBUTEROL SULFATE HFA 108 (90 BASE) MCG/ACT IN AERS
2.0000 | INHALATION_SPRAY | Freq: Once | RESPIRATORY_TRACT | Status: AC
  Administered 2022-12-17: 2 via RESPIRATORY_TRACT
  Filled 2022-12-16: qty 6.7

## 2022-12-16 NOTE — ED Provider Notes (Signed)
[name] Provider Note   CSN: [id number] Arrival date & time: 12/16/22  2058     History {Add pertinent medical, surgical, social history, OB history to HPI:1} Chief Complaint  Patient presents with   Nasal Congestion   Headache    Gabrielle Huff is a 32 y.o. female.  Patient presents with a 3-day history of progressively worsening central chest pain that radiates to her right shoulder and upper back.  Pain is constant, worse with movement and worse with breathing.  Associate with shortness of breath.  States the pain in her arm and chest is so bad that she cannot put her purse on that side of her body.  The pain starts in her right side of her chest and radiates diffusely to her upper back and chest area.  Associate with shortness of breath and nonproductive cough.  Has had nasal congestion and gradual onset diffuse headache as well.  No nausea or vomiting.  No diarrhea.  No pain with urination or blood in the urine. Has had URI congestion symptoms as well including runny nose and sore throat. History of heart murmur as well as DVT in the past.  No longer on blood thinners.  No travel or sick contacts.  The history is provided by the patient.  Headache Associated symptoms: congestion, cough, myalgias and sore throat   Associated symptoms: no abdominal pain, no fever, no nausea, no vomiting and no weakness        Home Medications Prior to Admission medications   Not on File      Allergies    Patient has no known allergies.    Review of Systems   Review of Systems  Constitutional:  Negative for activity change, appetite change and fever.  HENT:  Positive for congestion, rhinorrhea and sore throat.   Respiratory:  Positive for cough.   Gastrointestinal:  Negative for abdominal pain, nausea and vomiting.  Genitourinary:  Negative for dysuria and flank pain.  Musculoskeletal:  Positive for arthralgias and myalgias.   Skin:  Negative for rash.  Neurological:  Positive for headaches. Negative for weakness.   all other systems are negative except as noted in the HPI and PMH.    Physical Exam Updated Vital Signs BP 118/76 (BP Location: Left Arm)   Pulse 92   Temp 99.3 F (37.4 C) (Oral)   Resp 16   Ht 5\' 4"  (1.626 m)   Wt 45.4 kg   LMP 12/02/2022   SpO2 99%   BMI 17.16 kg/m  Physical Exam Vitals and nursing note reviewed.  Constitutional:      General: She is not in acute distress.    Appearance: She is well-developed. She is not ill-appearing.  HENT:     Head: Normocephalic and atraumatic.     Mouth/Throat:     Pharynx: No oropharyngeal exudate.  Eyes:     Conjunctiva/sclera: Conjunctivae normal.     Pupils: Pupils are equal, round, and reactive to light.  Neck:     Comments: No meningismus. Cardiovascular:     Rate and Rhythm: Normal rate and regular rhythm.     Heart sounds: Normal heart sounds. No murmur heard. Pulmonary:     Effort: Pulmonary effort is normal. No respiratory distress.     Breath sounds: Normal breath sounds.  Chest:     Chest wall: Tenderness present.  Abdominal:     Palpations: Abdomen is soft.     Tenderness: There is no  abdominal tenderness. There is no guarding or rebound.  Musculoskeletal:        General: No tenderness. Normal range of motion.     Cervical back: Normal range of motion and neck supple.  Skin:    General: Skin is warm.  Neurological:     Mental Status: She is alert and oriented to person, place, and time.     Cranial Nerves: No cranial nerve deficit.     Motor: No abnormal muscle tone.     Coordination: Coordination normal.     Comments:  5/5 strength throughout. CN 2-12 intact.Equal grip strength.   Psychiatric:        Behavior: Behavior normal.     ED Results / Procedures / Treatments   Labs (all labs ordered are listed, but only abnormal results are displayed) Labs Reviewed  RESP PANEL BY RT-PCR (RSV, FLU A&B, COVID)  RVPGX2  - Abnormal; Notable for the following components:      Result Value   SARS Coronavirus 2 by RT PCR POSITIVE (*)    All other components within normal limits    EKG None  Radiology No results found.  Procedures Procedures  {Document cardiac monitor, telemetry assessment procedure when appropriate:1}  Medications Ordered in ED Medications  albuterol (VENTOLIN HFA) 108 (90 Base) MCG/ACT inhaler 2 puff (has no administration in time range)  acetaminophen (TYLENOL) tablet 650 mg (has no administration in time range)    ED Course/ Medical Decision Making/ A&P   {   Click here for ABCD2, HEART and other calculatorsREFRESH Note before signing :1}                              Medical Decision Making Amount and/or Complexity of Data Reviewed Labs: ordered. Decision-making details documented in ED Course. Radiology: ordered and independent interpretation performed. Decision-making details documented in ED Course. ECG/medicine tests: ordered and independent interpretation performed. Decision-making details documented in ED Course.  Risk OTC drugs. Prescription drug management.   3 days of constant chest pressure, shortness of breath, headache, congestion and cough.  Found to be COVID-positive in triage.  No hypoxia or increased work of breathing.  Clear lungs.  Suspect majority of symptoms secondary to COVID.  Low suspicion for ACS.  However patient does have a history of DVT remotely so do need to consider pulmonary embolism.  {Document critical care time when appropriate:1} {Document review of labs and clinical decision tools ie heart score, Chads2Vasc2 etc:1}  {Document your independent review of radiology images, and any outside records:1} {Document your discussion with family members, caretakers, and with consultants:1} {Document social determinants of health affecting pt's care:1} {Document your decision making why or why not admission, treatments were needed:1} Final  Clinical Impression(s) / ED Diagnoses Final diagnoses:  None    Rx / DC Orders ED Discharge Orders     None

## 2022-12-16 NOTE — ED Triage Notes (Signed)
Congestion, headache for 2 weeks that has progressively gotten worse.

## 2022-12-17 ENCOUNTER — Emergency Department (HOSPITAL_COMMUNITY): Payer: Medicaid Other

## 2022-12-17 ENCOUNTER — Encounter (HOSPITAL_COMMUNITY): Payer: Self-pay

## 2022-12-17 LAB — CBC WITH DIFFERENTIAL/PLATELET
Abs Immature Granulocytes: 0.01 10*3/uL (ref 0.00–0.07)
Basophils Absolute: 0 10*3/uL (ref 0.0–0.1)
Basophils Relative: 0 %
Eosinophils Absolute: 0 10*3/uL (ref 0.0–0.5)
Eosinophils Relative: 1 %
HCT: 40.3 % (ref 36.0–46.0)
Hemoglobin: 12 g/dL (ref 12.0–15.0)
Immature Granulocytes: 0 %
Lymphocytes Relative: 27 %
Lymphs Abs: 1.2 10*3/uL (ref 0.7–4.0)
MCH: 21.8 pg — ABNORMAL LOW (ref 26.0–34.0)
MCHC: 29.8 g/dL — ABNORMAL LOW (ref 30.0–36.0)
MCV: 73.3 fL — ABNORMAL LOW (ref 80.0–100.0)
Monocytes Absolute: 0.9 10*3/uL (ref 0.1–1.0)
Monocytes Relative: 19 %
Neutro Abs: 2.4 10*3/uL (ref 1.7–7.7)
Neutrophils Relative %: 53 %
Platelets: 234 10*3/uL (ref 150–400)
RBC: 5.5 MIL/uL — ABNORMAL HIGH (ref 3.87–5.11)
RDW: 14.3 % (ref 11.5–15.5)
WBC: 4.5 10*3/uL (ref 4.0–10.5)
nRBC: 0 % (ref 0.0–0.2)

## 2022-12-17 LAB — BASIC METABOLIC PANEL
Anion gap: 8 (ref 5–15)
BUN: 9 mg/dL (ref 6–20)
CO2: 23 mmol/L (ref 22–32)
Calcium: 9.2 mg/dL (ref 8.9–10.3)
Chloride: 102 mmol/L (ref 98–111)
Creatinine, Ser: 0.71 mg/dL (ref 0.44–1.00)
GFR, Estimated: >60 mL/min (ref >60)
Glucose, Bld: 88 mg/dL (ref 70–99)
Potassium: 4.1 mmol/L (ref 3.5–5.1)
Sodium: 133 mmol/L — ABNORMAL LOW (ref 135–145)

## 2022-12-17 LAB — TROPONIN I (HIGH SENSITIVITY)
Troponin I (High Sensitivity): <2 ng/L (ref <18)
Troponin I (High Sensitivity): <2 ng/L (ref <18)

## 2022-12-17 MED ORDER — SODIUM CHLORIDE (PF) 0.9 % IJ SOLN
INTRAMUSCULAR | Status: AC
  Filled 2022-12-17: qty 50

## 2022-12-17 MED ORDER — IOHEXOL 350 MG/ML SOLN
75.0000 mL | Freq: Once | INTRAVENOUS | Status: AC | PRN
  Administered 2022-12-17: 75 mL via INTRAVENOUS

## 2022-12-17 NOTE — ED Notes (Signed)
Patient transported to CT 

## 2022-12-17 NOTE — Discharge Instructions (Signed)
No evidence of heart attack or blood clot in the lung.  No evidence of pneumonia.  Your COVID test is positive.  Continue oral hydration at home.  Use Tylenol and Motrin as needed for aches and fever.  Keep yourself quarantined for total of 5 days from the onset of your symptoms.  Return to the ED if difficulty breathing, chest pain, unable to eat or drink or any concerns.

## 2023-02-13 ENCOUNTER — Encounter: Payer: Self-pay | Admitting: Family Medicine

## 2023-02-13 ENCOUNTER — Ambulatory Visit: Payer: Medicaid Other | Admitting: Family Medicine

## 2023-02-13 VITALS — BP 100/60 | HR 91 | Temp 98.9°F | Wt 106.0 lb

## 2023-02-13 DIAGNOSIS — L988 Other specified disorders of the skin and subcutaneous tissue: Secondary | ICD-10-CM | POA: Diagnosis not present

## 2023-02-13 NOTE — Progress Notes (Signed)
   Subjective:  HPI: Gabrielle Huff is a 32 y.o. female presenting on 02/13/2023 for Follow-up (To further diagnose/investigate bump on the side of left boob.//)   HPI Patient is in today for a small bump of the skin on the left side of her left breast for approx 3 weeks. She reports it started as a tiny mole and grew in size but has now reduced in size to pinpoint. It is tender but she denies redness or drainage, no breast tenderness, fever.Marland Kitchen She was seen in urgent care a week ago for this and prescribed antibiotics that she has not taken. She has been limiting how often she wears a bra and this lesion is improving.  Review of Systems  All other systems reviewed and are negative.   Relevant past medical history reviewed and updated as indicated.   Past Medical History:  Diagnosis Date   Anemia    DVT (deep vein thrombosis) in pregnancy    Dysmenorrhea    Heart murmur    HSV-1 infection      Past Surgical History:  Procedure Laterality Date   CYST REMOVAL HAND     NO PAST SURGERIES      Allergies and medications reviewed and updated.   Current Outpatient Medications:    levonorgestrel (MIRENA) 20 MCG/DAY IUD, 1 each by Intrauterine route once., Disp: , Rfl:   No Known Allergies  Objective:   BP 100/60   Pulse 91   Temp 98.9 F (37.2 C) (Oral)   Wt 106 lb (48.1 kg)   LMP  (Approximate)   SpO2 99%   BMI 18.19 kg/m      02/13/2023    2:23 PM 12/17/2022    4:30 AM 12/17/2022    4:15 AM  Vitals with BMI  Weight 106 lbs    Systolic 100 92 114  Diastolic 60 69 79  Pulse 91 76 77     Physical Exam Vitals and nursing note reviewed. Exam conducted with a chaperone present.  Constitutional:      Appearance: Normal appearance. She is normal weight.  HENT:     Head: Normocephalic and atraumatic.  Skin:    General: Skin is warm and dry.     Findings: Lesion present.       Neurological:     General: No focal deficit present.     Mental Status: She is  alert and oriented to person, place, and time. Mental status is at baseline.  Psychiatric:        Mood and Affect: Mood normal.        Behavior: Behavior normal.        Thought Content: Thought content normal.        Judgment: Judgment normal.     Assessment & Plan:  Skin lesion of breast Assessment & Plan: Gabrielle Huff presents today with a pinpoint dark lesion to her left breast. There is no mass, abscess, drainage, redness, or warmth. Overall improving from prior reported symptoms. I encouraged her to use antibiotic ointment and cover with a Band-Aid to prevent rubbing when she wears a bra. Return to office if this persists or for increase in size, swelling, redness, warmth, or tenderness.      Follow up plan: Return if symptoms worsen or fail to improve.  Park Meo, FNP

## 2023-02-13 NOTE — Assessment & Plan Note (Signed)
Ms Pretti presents today with a pinpoint dark lesion to her left breast. There is no mass, abscess, drainage, redness, or warmth. Overall improving from prior reported symptoms. I encouraged her to use antibiotic ointment and cover with a Band-Aid to prevent rubbing when she wears a bra. Return to office if this persists or for increase in size, swelling, redness, warmth, or tenderness.

## 2023-05-16 ENCOUNTER — Emergency Department (HOSPITAL_COMMUNITY)
Admission: EM | Admit: 2023-05-16 | Discharge: 2023-05-16 | Disposition: A | Discharge status: Home / Self Care | Payer: Medicaid Other | Attending: Emergency Medicine | Admitting: Emergency Medicine

## 2023-05-16 ENCOUNTER — Other Ambulatory Visit: Payer: Self-pay

## 2023-05-16 ENCOUNTER — Emergency Department (HOSPITAL_COMMUNITY): Payer: Medicaid Other

## 2023-05-16 DIAGNOSIS — M791 Myalgia, unspecified site: Secondary | ICD-10-CM | POA: Diagnosis not present

## 2023-05-16 DIAGNOSIS — J069 Acute upper respiratory infection, unspecified: Secondary | ICD-10-CM | POA: Insufficient documentation

## 2023-05-16 DIAGNOSIS — R509 Fever, unspecified: Secondary | ICD-10-CM

## 2023-05-16 DIAGNOSIS — Z20822 Contact with and (suspected) exposure to covid-19: Secondary | ICD-10-CM | POA: Insufficient documentation

## 2023-05-16 LAB — COMPREHENSIVE METABOLIC PANEL
ALT: 11 U/L (ref 0–44)
AST: 24 U/L (ref 15–41)
Albumin: 4.6 g/dL (ref 3.5–5.0)
Alkaline Phosphatase: 45 U/L (ref 38–126)
Anion gap: 11 (ref 5–15)
BUN: 7 mg/dL (ref 6–20)
CO2: 21 mmol/L — ABNORMAL LOW (ref 22–32)
Calcium: 9.2 mg/dL (ref 8.9–10.3)
Chloride: 101 mmol/L (ref 98–111)
Creatinine, Ser: 0.43 mg/dL — ABNORMAL LOW (ref 0.44–1.00)
GFR, Estimated: >60 mL/min (ref >60)
Glucose, Bld: 116 mg/dL — ABNORMAL HIGH (ref 70–99)
Potassium: 3.8 mmol/L (ref 3.5–5.1)
Sodium: 133 mmol/L — ABNORMAL LOW (ref 135–145)
Total Bilirubin: 1.9 mg/dL — ABNORMAL HIGH (ref 0.0–1.2)
Total Protein: 8.3 g/dL — ABNORMAL HIGH (ref 6.5–8.1)

## 2023-05-16 LAB — CBC WITH DIFFERENTIAL/PLATELET
Abs Immature Granulocytes: 0.03 10*3/uL (ref 0.00–0.07)
Basophils Absolute: 0 10*3/uL (ref 0.0–0.1)
Basophils Relative: 0 %
Eosinophils Absolute: 0 10*3/uL (ref 0.0–0.5)
Eosinophils Relative: 0 %
HCT: 38.4 % (ref 36.0–46.0)
Hemoglobin: 11.4 g/dL — ABNORMAL LOW (ref 12.0–15.0)
Immature Granulocytes: 0 %
Lymphocytes Relative: 3 %
Lymphs Abs: 0.2 10*3/uL — ABNORMAL LOW (ref 0.7–4.0)
MCH: 21.5 pg — ABNORMAL LOW (ref 26.0–34.0)
MCHC: 29.7 g/dL — ABNORMAL LOW (ref 30.0–36.0)
MCV: 72.3 fL — ABNORMAL LOW (ref 80.0–100.0)
Monocytes Absolute: 0.6 10*3/uL (ref 0.1–1.0)
Monocytes Relative: 7 %
Neutro Abs: 7.3 10*3/uL (ref 1.7–7.7)
Neutrophils Relative %: 90 %
Platelets: 213 10*3/uL (ref 150–400)
RBC: 5.31 MIL/uL — ABNORMAL HIGH (ref 3.87–5.11)
RDW: 14.6 % (ref 11.5–15.5)
WBC: 8.1 10*3/uL (ref 4.0–10.5)
nRBC: 0 % (ref 0.0–0.2)

## 2023-05-16 LAB — RESP PANEL BY RT-PCR (RSV, FLU A&B, COVID)  RVPGX2
Influenza A by PCR: NEGATIVE
Influenza B by PCR: NEGATIVE
Resp Syncytial Virus by PCR: NEGATIVE
SARS Coronavirus 2 by RT PCR: NEGATIVE

## 2023-05-16 LAB — I-STAT CG4 LACTIC ACID, ED: Lactic Acid, Venous: 1.8 mmol/L (ref 0.5–1.9)

## 2023-05-16 LAB — HCG, SERUM, QUALITATIVE: Preg, Serum: NEGATIVE

## 2023-05-16 LAB — CK: Total CK: 206 U/L (ref 38–234)

## 2023-05-16 MED ORDER — KETOROLAC TROMETHAMINE 15 MG/ML IJ SOLN
15.0000 mg | Freq: Once | INTRAMUSCULAR | Status: AC
  Administered 2023-05-16: 15 mg via INTRAVENOUS
  Filled 2023-05-16: qty 1

## 2023-05-16 MED ORDER — SODIUM CHLORIDE 0.9 % IV BOLUS
2000.0000 mL | Freq: Once | INTRAVENOUS | Status: AC
  Administered 2023-05-16: 2000 mL via INTRAVENOUS

## 2023-05-16 MED ORDER — ACETAMINOPHEN 500 MG PO TABS: 1000.0000 mg | ORAL_TABLET | ORAL | Status: DC

## 2023-05-16 MED ORDER — ACETAMINOPHEN 500 MG PO TABS
1000.0000 mg | ORAL_TABLET | ORAL | Status: AC
  Administered 2023-05-16: 1000 mg via ORAL
  Filled 2023-05-16: qty 2

## 2023-05-16 NOTE — ED Triage Notes (Signed)
Patient c/o flu like symptoms x 1 day. Paitent report taking OTC without relief. Patient report worsening lower back pain.Patient denies N/V.

## 2023-05-16 NOTE — Discharge Instructions (Signed)
You were seen for your upper respiratory tract infection in the emergency department.   At home, please use Tylenol and ibuprofen for your muscle aches and fevers.  Please use over-the-counter cough medication or tea with honey for your cough.  Follow-up with your primary doctor in 2-3 days regarding your visit.  This may be over the phone if you are feeling better or in person if you are feeling worse.  Return immediately to the emergency department if you experience any of the following: Difficulty breathing, or any other concerning symptoms.    Thank you for visiting our Emergency Department. It was a pleasure taking care of you today.

## 2023-05-16 NOTE — ED Provider Notes (Signed)
[place] EMERGENCY DEPARTMENT AT [facility] Provider Note   CSN: [id number] Arrival date & time: 05/16/23  2016     History {Add pertinent medical, surgical, social history, OB history to HPI:1} Chief Complaint  Patient presents with   Flu like symptoms   Back Pain    Gabrielle Huff is a 33 y.o. female.  33 year old female history of DVT in pregnancy who presents to the emergency department with flulike symptoms and back and leg pain.  Says that her son is sick.  Today she started to become sick with flulike symptoms.  Congestion, cough, shortness of breath.  Says that she is having diffuse myalgias that are worse in her back and legs.  No bowel or bladder incontinence.  Says that her legs feel weak due to the pain.  Has tried oscillococcinum cold and flu medicine without relief.  No history of IV drug use or indwelling lines.  No recent dental procedures or procedures otherwise.       Home Medications Prior to Admission medications   Medication Sig Start Date End Date Taking? Authorizing Provider  levonorgestrel (MIRENA) 20 MCG/DAY IUD 1 each by Intrauterine route once.    [provider]      Allergies    Patient has no known allergies.    Review of Systems   Review of Systems  Physical Exam Updated Vital Signs BP 107/66   Pulse (!) 149   Temp (!) 101.3 F (38.5 C) (Oral)   Resp 20   Ht 5\' 4"  (1.626 m)   Wt 48.1 kg   SpO2 100%   BMI 18.20 kg/m  Physical Exam Vitals and nursing note reviewed.  Constitutional:      General: She is not in acute distress.    Appearance: She is well-developed.  HENT:     Head: Normocephalic and atraumatic.     Right Ear: External ear normal.     Left Ear: External ear normal.     Nose: Nose normal.  Eyes:     Extraocular Movements: Extraocular movements intact.     Conjunctiva/sclera: Conjunctivae normal.     Pupils: Pupils are equal, round, and reactive to light.  Cardiovascular:     Rate and  Rhythm: Regular rhythm. Tachycardia present.     Heart sounds: No murmur heard. Pulmonary:     Effort: Pulmonary effort is normal. No respiratory distress.     Breath sounds: Normal breath sounds.  Musculoskeletal:     Cervical back: Normal range of motion and neck supple.     Right lower leg: No edema.     Left lower leg: No edema.     Comments: Compartments soft of the thighs.  Does have significant tenderness to palpation of the thighs.  No midline C-spine, thoracic, or lumbar spine tenderness to palpation.  Does have paraspinal tenderness to palpation of the lumbar spine bilaterally.  4/5 in bilateral upper and lower extremities limited due to pain.  Patellar reflexes 2+ bilaterally.  Achilles reflexes 2+ bilaterally.  No ankle clonus bilaterally.  Skin:    General: Skin is warm and dry.  Neurological:     Mental Status: She is alert and oriented to person, place, and time. Mental status is at baseline.  Psychiatric:        Mood and Affect: Mood normal.     ED Results / Procedures / Treatments   Labs (all labs ordered are listed, but only abnormal results are displayed) Labs Reviewed  RESP  PANEL BY RT-PCR (RSV, FLU A&B, COVID)  RVPGX2  COMPREHENSIVE METABOLIC PANEL  CBC WITH DIFFERENTIAL/PLATELET  URINALYSIS, W/ REFLEX TO CULTURE (INFECTION SUSPECTED)  HCG, SERUM, QUALITATIVE  I-STAT CG4 LACTIC ACID, ED    EKG None  Radiology No results found.  Procedures Procedures  {Document cardiac monitor, telemetry assessment procedure when appropriate:1}  Medications Ordered in ED Medications - No data to display  ED Course/ Medical Decision Making/ A&P   {   Click here for ABCD2, HEART and other calculatorsREFRESH Note before signing :1}                              Medical Decision Making Amount and/or Complexity of Data Reviewed Labs: ordered. Radiology: ordered.  Risk OTC drugs. Prescription drug management.   Gabrielle Huff is a 33 y.o. female with  comorbidities that complicate the patient evaluation including DVT in pregnancy presents emergency department with flulike symptoms, back and leg pain  Initial Ddx:  URI, sepsis, rhabdomyolysis, spinal epidural abscess, PE  MDM/Course:  Patient presents emergency department with URI symptoms.  Has a child at home that is sick as well.  Complaining of severe back and leg pain.  Initially was globally weak due to pain.  She was given IV fluids, Toradol, and ibuprofen.  Was feeling much better afterwards.  Reexamined her and she has full strength in all extremities.  Heart rate has improved as well.  Did consider spinal epidural abscess with her fever and tachycardia and weakness but without a repeat evaluation being reassuring and lack of risk factors for this feel this is highly unlikely.  COVID and flu negative.  Chest x-ray without pneumonia.  Did consider PE with patient not complaining of chest pain is not hypoxic also with her fever and tachycardia suspected infectious source of her symptoms.  Did have a CK that was sent and showed***.  Patient instructed to follow-up with her primary doctor in several days and to stay well-hydrated and take Tylenol and ibuprofen for symptoms.  This patient presents to the ED for concern of complaints listed in HPI, this involves an extensive number of treatment options, and is a complaint that carries with it a high risk of complications and morbidity. Disposition including potential need for admission considered.   Dispo: DC Home. Return precautions discussed including, but not limited to, those listed in the AVS. Allowed pt time to ask questions which were answered fully prior to dc.  Additional history obtained from spouse Records reviewed Outpatient Clinic Notes The following labs were independently interpreted: Chemistry and show no acute abnormality I independently reviewed the following imaging with scope of interpretation limited to determining acute  life threatening conditions related to emergency care: Chest x-ray and agree with the radiologist interpretation with the following exceptions: none I personally reviewed and interpreted cardiac monitoring: sinus tachycardia I personally reviewed and interpreted the pt's EKG: see above for interpretation  I have reviewed the patients home medications and made adjustments as needed  Portions of this note were generated with Dragon dictation software. Dictation errors may occur despite best attempts at proofreading.    {Document your decision making why or why not admission, treatments were needed:1} Final Clinical Impression(s) / ED Diagnoses Final diagnoses:  None    Rx / DC Orders ED Discharge Orders     None
